# Patient Record
Sex: Female | Born: 1960
Health system: Southern US, Community
[De-identification: ages and names within clinical notes are randomized; demographics above are authoritative.]

## PROBLEM LIST (undated history)

## (undated) DIAGNOSIS — I10 Essential (primary) hypertension: Secondary | ICD-10-CM

## (undated) DIAGNOSIS — K859 Acute pancreatitis without necrosis or infection, unspecified: Secondary | ICD-10-CM

## (undated) DIAGNOSIS — E785 Hyperlipidemia, unspecified: Secondary | ICD-10-CM

## (undated) DIAGNOSIS — N809 Endometriosis, unspecified: Secondary | ICD-10-CM

## (undated) DIAGNOSIS — K5790 Diverticulosis of intestine, part unspecified, without perforation or abscess without bleeding: Secondary | ICD-10-CM

## (undated) DIAGNOSIS — Z8679 Personal history of other diseases of the circulatory system: Secondary | ICD-10-CM

## (undated) DIAGNOSIS — E119 Type 2 diabetes mellitus without complications: Secondary | ICD-10-CM

## (undated) DIAGNOSIS — K259 Gastric ulcer, unspecified as acute or chronic, without hemorrhage or perforation: Secondary | ICD-10-CM

## (undated) DIAGNOSIS — K8020 Calculus of gallbladder without cholecystitis without obstruction: Secondary | ICD-10-CM

## (undated) DIAGNOSIS — G43909 Migraine, unspecified, not intractable, without status migrainosus: Secondary | ICD-10-CM

## (undated) HISTORY — DX: Essential (primary) hypertension: I10

## (undated) HISTORY — DX: Acute pancreatitis without necrosis or infection, unspecified: K85.90

## (undated) HISTORY — DX: Migraine, unspecified, not intractable, without status migrainosus: G43.909

## (undated) HISTORY — DX: Diverticulosis of intestine, part unspecified, without perforation or abscess without bleeding: K57.90

## (undated) HISTORY — DX: Personal history of other diseases of the circulatory system: Z86.79

## (undated) HISTORY — DX: Calculus of gallbladder without cholecystitis without obstruction: K80.20

## (undated) HISTORY — DX: Hyperlipidemia, unspecified: E78.5

## (undated) HISTORY — DX: Endometriosis, unspecified: N80.9

## (undated) HISTORY — DX: Type 2 diabetes mellitus without complications: E11.9

## (undated) HISTORY — DX: Gastric ulcer, unspecified as acute or chronic, without hemorrhage or perforation: K25.9

---

## 1989-08-20 HISTORY — PX: ABDOMINAL HYSTERECTOMY: SHX81

## 1991-08-21 HISTORY — PX: CHOLECYSTECTOMY: SHX55

## 2002-08-20 HISTORY — PX: LUMBAR DISC SURGERY: SHX700

## 2008-10-12 DIAGNOSIS — E8941 Symptomatic postprocedural ovarian failure: Secondary | ICD-10-CM | POA: Insufficient documentation

## 2009-03-30 DIAGNOSIS — E785 Hyperlipidemia, unspecified: Secondary | ICD-10-CM | POA: Insufficient documentation

## 2010-05-24 DIAGNOSIS — R519 Headache, unspecified: Secondary | ICD-10-CM | POA: Insufficient documentation

## 2011-08-23 DIAGNOSIS — J310 Chronic rhinitis: Secondary | ICD-10-CM | POA: Insufficient documentation

## 2014-10-11 ENCOUNTER — Telehealth: Payer: Self-pay | Admitting: *Deleted

## 2014-10-11 ENCOUNTER — Ambulatory Visit (INDEPENDENT_AMBULATORY_CARE_PROVIDER_SITE_OTHER): Payer: BLUE CROSS/BLUE SHIELD | Admitting: Family

## 2014-10-11 ENCOUNTER — Encounter: Payer: Self-pay | Admitting: Family

## 2014-10-11 VITALS — BP 132/88 | HR 91 | Temp 98.0°F | Resp 16 | Ht 61.0 in | Wt 162.4 lb

## 2014-10-11 DIAGNOSIS — Z8679 Personal history of other diseases of the circulatory system: Secondary | ICD-10-CM | POA: Insufficient documentation

## 2014-10-11 DIAGNOSIS — Z8619 Personal history of other infectious and parasitic diseases: Secondary | ICD-10-CM

## 2014-10-11 DIAGNOSIS — Z23 Encounter for immunization: Secondary | ICD-10-CM

## 2014-10-11 DIAGNOSIS — G43909 Migraine, unspecified, not intractable, without status migrainosus: Secondary | ICD-10-CM

## 2014-10-11 DIAGNOSIS — Z0000 Encounter for general adult medical examination without abnormal findings: Secondary | ICD-10-CM

## 2014-10-11 DIAGNOSIS — I10 Essential (primary) hypertension: Secondary | ICD-10-CM

## 2014-10-11 DIAGNOSIS — E119 Type 2 diabetes mellitus without complications: Secondary | ICD-10-CM

## 2014-10-11 LAB — LIPID PANEL
CHOL/HDL RATIO: 3
Cholesterol: 169 mg/dL (ref 0–200)
HDL: 65.6 mg/dL (ref >39.00)
LDL CALC: 78 mg/dL (ref 0–99)
NONHDL: 103.4
TRIGLYCERIDES: 128 mg/dL (ref 0.0–149.0)
VLDL: 25.6 mg/dL (ref 0.0–40.0)

## 2014-10-11 LAB — BASIC METABOLIC PANEL
BUN: 11 mg/dL (ref 6–23)
CO2: 28 mEq/L (ref 19–32)
Calcium: 10.2 mg/dL (ref 8.4–10.5)
Chloride: 101 mEq/L (ref 96–112)
Creatinine, Ser: 0.59 mg/dL (ref 0.40–1.20)
GFR: 113.41 mL/min (ref >60.00)
GLUCOSE: 107 mg/dL — AB (ref 70–99)
Potassium: 4.3 mEq/L (ref 3.5–5.1)
Sodium: 138 mEq/L (ref 135–145)

## 2014-10-11 LAB — HEMOGLOBIN A1C: HEMOGLOBIN A1C: 9.1 % — AB (ref 4.6–6.5)

## 2014-10-11 LAB — MICROALBUMIN / CREATININE URINE RATIO
Creatinine,U: 80 mg/dL
MICROALB UR: 0.7 mg/dL (ref 0.0–1.9)
Microalb Creat Ratio: 0.9 mg/g (ref 0.0–30.0)

## 2014-10-11 MED ORDER — INSULIN ASPART 100 UNIT/ML FLEXPEN: PEN_INJECTOR | SUBCUTANEOUS | Status: DC

## 2014-10-11 MED ORDER — AMITRIPTYLINE HCL 25 MG PO TABS: 25.0000 mg | ORAL_TABLET | Freq: Every day | ORAL | Status: DC

## 2014-10-11 MED ORDER — JANUMET XR 50-1000 MG PO TB24: 1.0000 | ORAL_TABLET | Freq: Two times a day (BID) | ORAL | Status: DC

## 2014-10-11 NOTE — Assessment & Plan Note (Signed)
Uncontrolled. Suspect that her symptoms are worsened by rebound from excessive use of imitrex and excedrin migraine. Trial of HS elavil (was intolerant to beta blocker.) Advised sparing use of imitrex and excedrin.

## 2014-10-11 NOTE — Patient Instructions (Addendum)
Decrease lantus to 27 units once daily.  Start amitryptilline 25mg  once daily at bedtime.  Try to limit your use of excedrin and imitrex. Complete lab work prior to leaving. Schedule complete physical at the front.  You will be contacted about your referral to endocrinology and mammogram.

## 2014-10-11 NOTE — Telephone Encounter (Signed)
Health Maintenance  updated

## 2014-10-11 NOTE — Addendum Note (Signed)
Addended by: Mervin KungFERGERSON, Charrie Mcconnon A on: 10/11/2014 10:16 AM   Modules accepted: Orders

## 2014-10-11 NOTE — Assessment & Plan Note (Signed)
Denies hx of cardiac issues. No murmur on exam.

## 2014-10-11 NOTE — Assessment & Plan Note (Signed)
Controlled on amlodipine. Will review old records- pt thinks she was intolerant to ACE.

## 2014-10-11 NOTE — Telephone Encounter (Signed)
3/15. thanks

## 2014-10-11 NOTE — Progress Notes (Signed)
Subjective:    Patient ID: Kathryn Rogers, female    DOB: 01/23/1961, 54 y.o.   MRN: [id number]  HPI  Kathryn Rogers is a 54 yr old female who presents today to establish care.  1) DM2- she was diagnosed 2009.  Pt is currently maintained on the following medications for diabetes:janumet, lantus Last A1C was- She is unsure. Last diabetic eye exam was- march 2015- reports no diabetic retinopathy Denies polyuria/polydipsia. Reports that she walked yesterday and then developed hypoglycemia last night at 12midnight.  Comes up with a glass of mild.  This AM sugar was 186.    lantus 31 units, counts carbs 1 unit per carb.  Home glucose readings range- 103-119 fasting Reports that she had a pneumovax 5 years ago.  2) Migraines- feels like the weather can worsen her migraines.  Reports that she has migraines 3-5 times a week. Uses extra strength excedrin.  She reports that she could not tolerate a beta blocker- made her "loopy."   3) HTN-Patient is currently maintained on the following medications for blood pressure: amlodipine. She thinks that she was on ace in the past and this caused nausea.   Patient reports good compliance with blood pressure medications.  Patient denies chest pain, shortness of breath or swelling. Last 3 blood pressure readings in our office are as follows:  BP Readings from Last 3 Encounters:  10/11/14 132/88   4) Hx of rheumatic fever- denies hx of cardiac issues or heart murmur.    Review of Systems  Constitutional: Negative for unexpected weight change.  HENT: Negative for hearing loss, postnasal drip and rhinorrhea.        Chronic rhinorrhea which she attributes to allergies  Eyes: Negative for visual disturbance.  Respiratory: Negative for cough.   Cardiovascular: Negative for leg swelling.  Gastrointestinal: Negative for nausea, diarrhea and constipation.  Endocrine: Negative for polyuria.  Genitourinary: Negative for dysuria and frequency.  Musculoskeletal:  Negative for myalgias and arthralgias.  Skin: Negative for rash.  Neurological: Positive for headaches.  Hematological: Negative for adenopathy.  Psychiatric/Behavioral: Negative for dysphoric mood and agitation.       Denies depression/anxiety   Past Medical History  Diagnosis Date  . Migraine   . Diabetes mellitus without complication   . Hypertension   . History of rheumatic fever     age 66  . Hyperlipidemia   . Diverticulosis     History   Social History  . Marital Status: Married    Spouse Name: N/A  . Number of Children: N/A  . Years of Education: N/A   Occupational History  . Not on file.   Social History Main Topics  . Smoking status: Never Smoker   . Smokeless tobacco: Never Used  . Alcohol Use: No  . Drug Use: No  . Sexual Activity: Not on file   Other Topics Concern  . Not on file   Social History Narrative   [place] ([place]sisters in [place])   Homemaker   Completed 12th grade   2 daughter (6 grandchildren) one grandchild lives with her.   [place] and [place]   Enjoys spending time with her 53 year old grand-daughter   Previously worked in [employer].    Past Surgical History  Procedure Laterality Date  . Cholecystectomy  1993  . Abdominal hysterectomy  1991  . Lumbar disc surgery  2004    L5, discectomy and fusion  .  Cesarean section      x 2    Family History  Problem Relation Age of Onset  . Hypertension Father   . Cancer Paternal Aunt     breast  . Alcohol abuse Paternal Grandfather   . Cancer Cousin     breast    Allergies  Allergen Reactions  . Codeine Other (See Comments)    Dizziness, n/v, head itches    No current outpatient prescriptions on file prior to visit.   No current facility-administered medications on file prior to visit.    BP 132/88 mmHg  Pulse 91  Temp(Src) 98 F (36.7 C) (Oral)  Resp 16  Ht  (1.549 m)  Wt 162 lb 6.4 oz (73.664 kg)  BMI  30.70 kg/m2  SpO2 99%       Objective:   Physical Exam  Constitutional: She is oriented to person, place, and time. She appears well-developed and well-nourished.  HENT:  Head: Normocephalic and atraumatic.  Mouth/Throat: No oropharyngeal exudate.  Eyes: Pupils are equal, round, and reactive to light. No scleral icterus.  Neck: Neck supple.  Cardiovascular: Normal rate, regular rhythm and normal heart sounds.   No murmur heard. Pulmonary/Chest: Effort normal and breath sounds normal. No respiratory distress. She has no wheezes.  Musculoskeletal: She exhibits no edema.  Lymphadenopathy:    She has no cervical adenopathy.  Neurological: She is alert and oriented to person, place, and time.  Skin: Skin is warm and dry.  Psychiatric: She has a normal mood and affect. Her behavior is normal. Judgment and thought content normal.          Assessment & Plan:  Pt requests order for screening mammogram.

## 2014-10-11 NOTE — Assessment & Plan Note (Signed)
Due to reports of hypoglycemia, will decrease her lantus from 31 units to 27 units once daily. Continue janumet, refer to endo.

## 2014-10-11 NOTE — Telephone Encounter (Signed)
-----   Message from Kathryn CrazeMelissa O'Sullivan, NP sent at 10/11/2014  8:22 AM EST ----- Pt had eye exam 3/16- negative for retinopathy.  thanks

## 2014-10-11 NOTE — Telephone Encounter (Signed)
Kathryn KaufmannMelissa-- is this 11/02/13? Or just 10/2013?

## 2014-10-14 ENCOUNTER — Telehealth: Payer: Self-pay | Admitting: Family

## 2014-10-14 NOTE — Telephone Encounter (Signed)
Attempted to reach pt at # in chart [phone]) but it doesn't ring; just silence. Sent pt [employer] message.

## 2014-10-14 NOTE — Telephone Encounter (Signed)
Please let pt know that her diabetes is not well controlled per lab work. I would like her to meet with endocrinology as we discussed at her visit. She should be contacted about her referral.

## 2014-10-26 ENCOUNTER — Telehealth: Payer: Self-pay | Admitting: *Deleted

## 2014-10-26 MED ORDER — ONETOUCH ULTRASOFT LANCETS MISC: 1.0000 | Status: DC | PRN

## 2014-10-26 NOTE — Telephone Encounter (Signed)
[facility]. Refill sent as pt has not established with endocrinology yet.

## 2014-10-27 ENCOUNTER — Telehealth: Payer: Self-pay | Admitting: *Deleted

## 2014-10-27 MED ORDER — SUMATRIPTAN SUCCINATE 100 MG PO TABS: 100.0000 mg | ORAL_TABLET | ORAL | Status: DC | PRN

## 2014-10-27 NOTE — Telephone Encounter (Signed)
Received fax requesting refill of sumatriptan 100mg  to [facility] [address]. Refill sent.

## 2014-11-05 ENCOUNTER — Ambulatory Visit (INDEPENDENT_AMBULATORY_CARE_PROVIDER_SITE_OTHER): Payer: BLUE CROSS/BLUE SHIELD | Admitting: Internal Medicine

## 2014-11-05 ENCOUNTER — Encounter: Payer: Self-pay | Admitting: Internal Medicine

## 2014-11-05 VITALS — BP 122/78 | HR 95 | Temp 98.8°F | Resp 12 | Ht 59.75 in | Wt 159.0 lb

## 2014-11-05 DIAGNOSIS — E119 Type 2 diabetes mellitus without complications: Secondary | ICD-10-CM

## 2014-11-05 MED ORDER — INSULIN GLARGINE 300 UNIT/ML ~~LOC~~ SOPN: 24.0000 [IU] | PEN_INJECTOR | Freq: Every day | SUBCUTANEOUS | Status: DC

## 2014-11-05 MED ORDER — GLIPIZIDE 5 MG PO TABS: 5.0000 mg | ORAL_TABLET | Freq: Two times a day (BID) | ORAL | Status: DC

## 2014-11-05 NOTE — Patient Instructions (Signed)
Please stop Lantus. Stop Humalog for now.  Please start Toujeo 24 units in am. Continue Janumet XR 50-1000 mg 2x a day, but move them before b'fast and before dinner. Start Glipizide 5 mg 2x a day, before b'fast and before dinner. You may increase the dose to 10 mg 2x a day, if sugars not better later in the day and if you have no lows. Please do not skip meals when you take this medicine.  Please let me know if the sugars are consistently <80 or >200.  Please call and schedule an eye appt with Dr. Randon Goldsmith: [facility]:  Dr. Jeni Salles MD ?  Address: [address] [place], [place], [place] [address]  [phone]  - Healthy nutrition info websites:  http://nutritionfacts.org  - Cinnamon issue:  EyeTint.com.ee  S[employer]In[employer].es.aspx  PATIENT INSTRUCTIONS FOR TYPE 2 DIABETES:  **Please join MyChart!** - see attached instructions about how to join if you have not done so already.  DIET AND EXERCISE Diet and exercise is an important part of diabetic treatment.  We recommended aerobic exercise in the form of brisk walking (working between 40-60% of maximal aerobic capacity, similar to brisk walking) for 150 minutes per week (such as 30 minutes five days per week) along with 3 times per week performing 'resistance' training (using various gauge rubber tubes with handles) 5-10 exercises involving the major muscle groups (upper body, lower body and core) performing 10-15 repetitions (or near fatigue) each exercise. Start at half the above goal but build slowly to reach the above goals. If limited by weight, joint pain, or disability, we recommend daily walking in a swimming pool with water up to waist to reduce pressure from joints while allow for adequate exercise.    BLOOD GLUCOSES Monitoring your blood glucoses is important for continued management of your  diabetes. Please check your blood glucoses 2-4 times a day: fasting, before meals and at bedtime (you can rotate these measurements - e.g. one day check before the 3 meals, the next day check before 2 of the meals and before bedtime, etc.).   HYPOGLYCEMIA (low blood sugar) Hypoglycemia is usually a reaction to not eating, exercising, or taking too much insulin/ other diabetes drugs.  Symptoms include tremors, sweating, hunger, confusion, headache, etc. Treat IMMEDIATELY with 15 grams of Carbs: . 4 glucose tablets .  cup regular juice/soda . 2 tablespoons raisins . 4 teaspoons sugar . 1 tablespoon honey Recheck blood glucose in 15 mins and repeat above if still symptomatic/blood glucose <100.  RECOMMENDATIONS TO REDUCE YOUR RISK OF DIABETIC COMPLICATIONS: * Take your prescribed MEDICATION(S) * Follow a DIABETIC diet: Complex carbs, fiber rich foods, (monounsaturated and polyunsaturated) fats * AVOID saturated/trans fats, high fat foods, >2,300 mg salt per day. * EXERCISE at least 5 times a week for 30 minutes or preferably daily.  * DO NOT SMOKE OR DRINK more than 1 drink a day. * Check your FEET every day. Do not wear tightfitting shoes. Contact us if you develop an ulcer * See your EYE doctor once a year or more if needed * Get a FLU shot once a year * Get a PNEUMONIA vaccine once before and once after age 48 years  GOALS:  * Your Hemoglobin A1c of <7%  * fasting sugars need to be <130 * after meals sugars need to be <180 (2h after you start eating) * Your Systolic BP should be 140 or lower  * Your Diastolic BP should be 80 or lower  *  Your HDL (Good Cholesterol) should be 40 or higher  * Your LDL (Bad Cholesterol) should be 100 or lower. * Your Triglycerides should be 150 or lower  * Your Urine microalbumin (kidney function) should be <30 * Your Body Mass Index should be 25 or lower    Please consider the following ways to cut down carbs and fat and increase fiber and  micronutrients in your diet: - substitute whole grain for white bread or pasta - substitute brown rice for white rice - substitute 90-calorie flat bread pieces for slices of bread when possible - substitute sweet potatoes or yams for white potatoes - substitute humus for margarine - substitute tofu for cheese when possible - substitute almond or rice milk for regular milk (would not drink soy milk daily due to concern for soy estrogen influence on breast cancer risk) - substitute dark chocolate for other sweets when possible - substitute water - can add lemon or orange slices for taste - for diet sodas (artificial sweeteners will trick your body that you can eat sweets without getting calories and will lead you to overeating and weight gain in the long run) - do not skip breakfast or other meals (this will slow down the metabolism and will result in more weight gain over time)  - can try smoothies made from fruit and almond/rice milk in am instead of regular breakfast - can also try old-fashioned (not instant) oatmeal made with almond/rice milk in am - order the dressing on the side when eating salad at a restaurant (pour less than half of the dressing on the salad) - eat as little meat as possible - can try juicing, but should not forget that juicing will get rid of the fiber, so would alternate with eating raw veg./fruits or drinking smoothies - use as little oil as possible, even when using olive oil - can dress a salad with a mix of balsamic vinegar and lemon juice, for e.g. - use agave nectar, stevia sugar, or regular sugar rather than artificial sweateners - steam or broil/roast veggies  - snack on veggies/fruit/nuts (unsalted, preferably) when possible, rather than processed foods - reduce or eliminate aspartame in diet (it is in diet sodas, chewing gum, etc) Read the labels!  Try to read Dr. Katherina RightNeal Barnard's book: "Program for Reversing Diabetes" for other ideas for healthy  eating.

## 2014-11-05 NOTE — Progress Notes (Signed)
Patient ID: Kathryn Rogers, female   DOB: 04/03/1961, 54 y.o.   MRN: [id number]  HPI: Kathryn Rogers is a 54 y.o.-year-old female, referred by her PCP, O'SULLIVAN,MELISSA S., NP, for management of DM2, dx in 2010, insulin-dependent since dx, uncontrolled, without complications.  Last hemoglobin A1c was: Lab Results  Component Value Date   HGBA1C 9.1* 10/11/2014  Before this, HbA1c was lower.  Pt is on a regimen of: - JanuMet XR 50-1000 mg 2x a day - before b'fast and lunch. - Lantus 29 units at bedtime - Novolog 3x a day, before meals - 1 Units per carb choice, ave 2 units per meal  Takes alphalipoic acid. Used to take Cinammon.  Pt checks her sugars 2x a day and they are: - am: 60, 110-115, 288  - 2h after b'fast: n/c - before lunch: n/c - 2h after lunch: 135-343 (may take 2 units of insulin) - before dinner: 49 x1, 93-394 - 2h after dinner:  - bedtime: 48 x1 - nighttime: n/c + lows. Lowest sugar was 30s, at night - lately 48 at night; she has hypoglycemia awareness at 80.  Highest sugar was in the 300s.  Glucometer: One Touch mini  Pt's meals are: - Breakfast: cereal or carnation instant b'fast - Lunch: grilled salmon, salad + apple - Dinner: grille chicken, grilled grilled, baked potatoes - Snacks: 2 maybe  She walks for exercise.  - no CKD, last BUN/creatinine:  Lab Results  Component Value Date   BUN 11 10/11/2014   CREATININE 0.59 10/11/2014   - last set of lipids: Lab Results  Component Value Date   CHOL 169 10/11/2014   HDL 65.60 10/11/2014   LDLCALC 78 10/11/2014   TRIG 128.0 10/11/2014   CHOLHDL 3 10/11/2014   - last eye exam was in 10/2013. No DR.  - no numbness and tingling in her feet.  Pt has no FH of DM.  ROS: Constitutional: + Weight loss, +  fatigue, +  hot flashes Eyes: no blurry vision, no xerophthalmia ENT: no sore throat, no nodules palpated in throat, no dysphagia/odynophagia, no hoarseness Cardiovascular: no CP/SOB/palpitations/leg  swelling Respiratory: no cough/SOB Gastrointestinal: no N/V/D/C Musculoskeletal: no muscle/joint aches Skin: no rashes Neurological: no tremors/numbness/tingling/dizziness, + HA Psychiatric: no depression/anxiety + low libido  Past Medical History  Diagnosis Date  . Migraine   . Diabetes mellitus without complication   . Hypertension   . History of rheumatic fever     age 89  . Hyperlipidemia   . Diverticulosis    Past Surgical History  Procedure Laterality Date  . Cholecystectomy  1993  . Abdominal hysterectomy  1991  . Lumbar disc surgery  2004    L5, discectomy and fusion  . Cesarean section      x 2   History   Social History  . Marital Status: Married    Spouse Name: N/A   Social History Main Topics  . Smoking status: Never Smoker   . Smokeless tobacco: Never Used  . Alcohol Use: No  . Drug Use: No   Social History Narrative   [place] in [place]   Homemaker   Completed 12th grade   2 daughter (6 grandchildren) one grandchild lives with her.   1 daughter in [place] and 1 in [place]   Enjoys spending time with her 53 year old grand-daughter   Previously worked in [employer].   Current Outpatient Prescriptions on File Prior to Visit  Medication Sig  Dispense Refill  . amitriptyline (ELAVIL) 25 MG tablet Take 1 tablet (25 mg total) by mouth at bedtime. 30 tablet 2  . amLODipine (NORVASC) 5 MG tablet Take 1 tablet by mouth daily.  0  . aspirin EC 81 MG tablet Take 81 mg by mouth daily.    . fluticasone (FLONASE) 50 MCG/ACT nasal spray As needed  0  . glucose blood test strip Use as instructed to check blood sugar 3 times daily.    . insulin aspart (NOVOLOG) 100 UNIT/ML FlexPen Use as tid prn. 15 mL 1  . Insulin Pen Needle 31G X 8 MM MISC by Does not apply route. Use to give insulin injections 3-4 times daily    . JANUMET XR 50-1000 MG TB24 Take 1 tablet by mouth 2 (two) times daily. 90 tablet 1  . Lancets  (ONETOUCH ULTRASOFT) lancets 1 each by Other route as needed for other. Use as instructed to check blood sugar 4 times a day. 400 each 0  . LANTUS SOLOSTAR 100 UNIT/ML Solostar Pen Inject 27 Units into the skin every morning.   2  . promethazine (PHENERGAN) 25 MG tablet Take 25 mg by mouth every 6 (six) hours as needed for nausea or vomiting (migraine).    . SUMAtriptan (IMITREX) 100 MG tablet Take 1 tablet (100 mg total) by mouth as needed. 10 tablet 3  . SUMAtriptan (IMITREX) 20 MG/ACT nasal spray Use 1 dose in the nostril as needed for migraine  5   No current facility-administered medications on file prior to visit.   Allergies  Allergen Reactions  . Codeine Other (See Comments)    Dizziness, n/v, head itches   Family History  Problem Relation Age of Onset  . Hypertension Father   . Cancer Paternal Aunt     breast  . Alcohol abuse Paternal Grandfather   . Cancer Cousin     breast   PE: BP 122/78 mmHg  Pulse 95  Temp(Src) 98.8 F (37.1 C) (Oral)  Resp 12  Ht 4' 11.75" (1.518 m)  Wt 159 lb (72.122 kg)  BMI 31.30 kg/m2  SpO2 97% Wt Readings from Last 3 Encounters:  11/05/14 159 lb (72.122 kg)  10/11/14 162 lb 6.4 oz (73.664 kg)   Constitutional: overweight, in NAD Eyes: PERRLA, EOMI, no exophthalmos ENT: moist mucous membranes, no thyromegaly, no cervical lymphadenopathy Cardiovascular: Tachycardia,RR, No MRG Respiratory: CTA B Gastrointestinal: abdomen soft, NT, ND, BS+ Musculoskeletal: no deformities, strength intact in all 4 Skin: moist, warm, no rashes Neurological: no tremor with outstretched hands, DTR normal in all 4  ASSESSMENT: 1. DM2, insulin-dependent, uncontrolled, without complications  PLAN:  1. Patient with long-standing, uncontrolled diabetes, on a regimen of basal insulin (+ few units of mealtime rapid-acting insulin) + JanuMet, which is insufficient. Her sugars are fluctuating in the morning, but in general lower than the ones later in the day, in  a staircase fashion. She can also have lows at night, in the 40s. I explained that this pattern is usually seen with an excess of basal insulin and not enough mealtime coverage. We will attempt to decrease the basal insulin and, per her request, try to use toujeo. Also, since she is using very few units of rapid acting insulin during the day, will try to stop this and start glipizide before breakfast and dinner. I am not sure if this is enough but I advised her to check her sugars frequently and let me know if they stay high or low. - I  would've also wanted to check her for type 1 diabetes, however, her sugars in the office today was 240, too high for [place] to get a reliable C-peptide level. I plan to do this at next visit. - We discussed about options for treatment, and I suggested to:  Patient Instructions  Please stop Lantus. Stop Humalog for now.  Please start Toujeo 24 units in am. Continue Janumet XR 50-1000 mg 2x a day, but move them before b'fast and before dinner. Start Glipizide 5 mg 2x a day, before b'fast and before dinner. You may increase the dose to 10 mg 2x a day, if sugars not better later in the day and if you have no lows. Please do not skip meals when you take this medicine.  Please let me know if the sugars are consistently <80 or >200.  Please call and schedule an eye appt with Dr. Randon Goldsmith: [facility]:  Dr. Jeni Salles MD ?  Address: [address] [place], [place], [place] [address]  [phone] - I recommended her to schedule another eye exam. She needs a new eye Dr., since she moved to [facility] recently and does not have one. Last eye exam was a year ago. No retinopathy per her report - We also discussed about nutrition, but she is not interested in a referral to the nutritionist as of now. - Strongly advised her to start checking sugars at different times of the day - check 3 times a day, rotating checks - given sugar log and advised how to fill it  and to bring it at next appt  - given foot care handout and explained the principles  - given instructions for hypoglycemia management "15-15 rule"  - Return to clinic in 1 mo with sugar log

## 2014-12-15 ENCOUNTER — Ambulatory Visit (INDEPENDENT_AMBULATORY_CARE_PROVIDER_SITE_OTHER): Payer: BLUE CROSS/BLUE SHIELD | Admitting: Internal Medicine

## 2014-12-15 ENCOUNTER — Encounter: Payer: Self-pay | Admitting: Internal Medicine

## 2014-12-15 VITALS — BP 132/90 | HR 88 | Temp 97.9°F | Ht 59.75 in | Wt 159.0 lb

## 2014-12-15 DIAGNOSIS — E119 Type 2 diabetes mellitus without complications: Secondary | ICD-10-CM | POA: Diagnosis not present

## 2014-12-15 MED ORDER — INSULIN ASPART 100 UNIT/ML FLEXPEN: 3.0000 [IU] | PEN_INJECTOR | Freq: Three times a day (TID) | SUBCUTANEOUS | Status: DC

## 2014-12-15 MED ORDER — INSULIN GLARGINE 300 UNIT/ML ~~LOC~~ SOPN: 25.0000 [IU] | PEN_INJECTOR | Freq: Every day | SUBCUTANEOUS | Status: DC

## 2014-12-15 NOTE — Patient Instructions (Signed)
Please continue: - Toujeo 25 units in am. - Janumet XR 50-1000 mg 2x a day before b'fast and before dinner.  Please stop glipizide.  Please start back: - NovoLog 3x a day, 15 min before a meal: 3 units for a small meal 4 units for a regular meal 5 units for a large meal or if you have dessert  Please return in 5 weeks with your sugar log.   Please let me know if the sugars are consistently <80 or >200.  Please stop at the lab.

## 2014-12-15 NOTE — Progress Notes (Signed)
Patient ID: Kathryn Rogers, female   DOB: Mar 15, 1961, 54 y.o.   MRN: [id number]  HPI: Kathryn Rogers is a 54 y.o.-year-old female, returning for management of DM2, dx in 2010, insulin-dependent since dx, uncontrolled, without complications. Last visit 1.5 mo ago.  Last hemoglobin A1c was: Lab Results  Component Value Date   HGBA1C 9.1* 10/11/2014  Before this, HbA1c was lower.  Pt is on a regimen of: - JanuMet XR 50-1000 mg 2x a day - before b'fast and lunch. - Toujeo 25 units at bedtime - Glipizide 5 mg 2x a day - added 10/2014 We stopped Novolog 3x a day, before meals - 1 Units per carb choice, ave 2 units per meal Takes alphalipoic acid. Used to take Cinammon.  Pt checks her sugars 2x a day and they are: - am: 60, 110-115, 288 >> 39x1, 50, 71-220, 276, 298 - 2h after b'fast: n/c >> 373 (forget meds that am) - before lunch: n/c >> 167 - 2h after lunch: 135-343 (may take 2 units of insulin) >> 204-330 - before dinner: 49 x1, 93-394 >> 176-233, 332 - 2h after dinner: 107-273, 488x1 - bedtime: 48 x1 >> n/c - nighttime: n/c + lows, but less than before. Lowest sugar was 30s; but had 1 low in am today (did not check) - exercise last night; she has hypoglycemia awareness at 80.  Highest sugar was in the 270.  Glucometer: One Touch mini  Pt's meals are: - Breakfast: cereal or carnation instant b'fast - Lunch: grilled salmon, salad + apple - Dinner: grille chicken, grilled grilled, baked potatoes - Snacks: 2 maybe  She walks for exercise.  - no CKD, last BUN/creatinine:  Lab Results  Component Value Date   BUN 11 10/11/2014   CREATININE 0.59 10/11/2014   - last set of lipids: Lab Results  Component Value Date   CHOL 169 10/11/2014   HDL 65.60 10/11/2014   LDLCALC 78 10/11/2014   TRIG 128.0 10/11/2014   CHOLHDL 3 10/11/2014   - last eye exam was in 10/2013. No DR.  - no numbness and tingling in her feet.  ROS: Constitutional: no Weight loss, no  fatigue, +  hot  flashes Eyes: no blurry vision, no xerophthalmia ENT: no sore throat, no nodules palpated in throat, no dysphagia/odynophagia, no hoarseness Cardiovascular: no CP/SOB/palpitations/leg swelling Respiratory: no cough/SOB Gastrointestinal: no N/V/D/C Musculoskeletal: no muscle/joint aches Skin: no rashes Neurological: no tremors/numbness/tingling/dizziness  I reviewed pt's medications, allergies, PMH, social hx, family hx, and changes were documented in the history of present illness. Otherwise, unchanged from my initial visit note: Past Medical History  Diagnosis Date  . Migraine   . Diabetes mellitus without complication   . Hypertension   . History of rheumatic fever     age 54  . Hyperlipidemia   . Diverticulosis    Past Surgical History  Procedure Laterality Date  . Cholecystectomy  1993  . Abdominal hysterectomy  1991  . Lumbar disc surgery  2004    L5, discectomy and fusion  . Cesarean section      x 2   History   Social History  . Marital Status: Married    Spouse Name: N/A   Social History Main Topics  . Smoking status: Never Smoker   . Smokeless tobacco: Never Used  . Alcohol Use: No  . Drug Use: No   Social History Narrative   Moved from [place] 2016, originally from [facility] (parents/sisters in [place])   Homemaker   Completed 12th grade  2 daughter (6 grandchildren) one grandchild lives with her.   1 daughter in [place] and 1 in [place]   Enjoys spending time with her 53 year old grand-daughter   Previously worked in [employer].   Current Outpatient Prescriptions on File Prior to Visit  Medication Sig Dispense Refill  . amitriptyline (ELAVIL) 25 MG tablet Take 1 tablet (25 mg total) by mouth at bedtime. 30 tablet 2  . amLODipine (NORVASC) 5 MG tablet Take 1 tablet by mouth daily.  0  . aspirin EC 81 MG tablet Take 81 mg by mouth daily.    . fluticasone (FLONASE) 50 MCG/ACT nasal spray As needed  0  . glipiZIDE (GLUCOTROL) 5 MG tablet Take 1 tablet (5 mg  total) by mouth 2 (two) times daily before a meal. 60 tablet 5  . glucose blood test strip Use as instructed to check blood sugar 3 times daily.    . Insulin Glargine (TOUJEO SOLOSTAR) 300 UNIT/ML SOPN Inject 24 Units into the skin daily before breakfast. 3 pen 2  . Insulin Pen Needle 31G X 8 MM MISC by Does not apply route. Use to give insulin injections 3-4 times daily    . JANUMET XR 50-1000 MG TB24 Take 1 tablet by mouth 2 (two) times daily. 90 tablet 1  . Lancets (ONETOUCH ULTRASOFT) lancets 1 each by Other route as needed for other. Use as instructed to check blood sugar 4 times a day. 400 each 0  . promethazine (PHENERGAN) 25 MG tablet Take 25 mg by mouth every 6 (six) hours as needed for nausea or vomiting (migraine).    . SUMAtriptan (IMITREX) 100 MG tablet Take 1 tablet (100 mg total) by mouth as needed. 10 tablet 3  . SUMAtriptan (IMITREX) 20 MG/ACT nasal spray Use 1 dose in the nostril as needed for migraine  5   No current facility-administered medications on file prior to visit.   Allergies  Allergen Reactions  . Codeine Other (See Comments)    Dizziness, n/v, head itches   Family History  Problem Relation Age of Onset  . Hypertension Father   . Cancer Paternal Aunt     breast  . Alcohol abuse Paternal Grandfather   . Cancer Cousin     breast   PE: BP 132/90 mmHg  Pulse 88  Temp(Src) 97.9 F (36.6 C) (Oral)  Ht 4' 11.75" (1.518 m)  Wt 159 lb (72.122 kg)  BMI 31.30 kg/m2  SpO2 97% Wt Readings from Last 3 Encounters:  12/15/14 159 lb (72.122 kg)  11/05/14 159 lb (72.122 kg)  10/11/14 162 lb 6.4 oz (73.664 kg)   Constitutional: overweight, in NAD Eyes: PERRLA, EOMI, no exophthalmos ENT: moist mucous membranes, no thyromegaly, no cervical lymphadenopathy Cardiovascular: Tachycardia,RR, No MRG Respiratory: CTA B Gastrointestinal: abdomen soft, NT, ND, BS+ Musculoskeletal: no deformities, strength intact in all 4 Skin: moist, warm, no rashes Neurological: no  tremor with outstretched hands, DTR normal in all 4  ASSESSMENT: 1. DM2, insulin-dependent, uncontrolled, without complications  PLAN:  1. Patient with long-standing, uncontrolled diabetes, on a regimen of basal insulin (switched to Toujeo at last visit) + JanuMet + added Glipizide instead of mealtime insulin at last visit. We will stop Glipizide and restart Novolog as this worked better for her. Her sugars are still greatly fluctuating  - I will also check her for type 1 diabetes - sugars in the office today was 125: Orders Placed This Encounter  Procedures  . C-peptide  . Glucose, Fasting  .  Glutamic acid decarboxylase auto abs  . Anti-islet cell antibody  - We discussed about options for treatment, and I suggested to:    Patient Instructions  Please continue: - Toujeo 25 units in am. - Janumet XR 50-1000 mg 2x a day before b'fast and before dinner.  Please stop glipizide.  Please start back: - NovoLog 3x a day, 15 min before a meal: 3 units for a small meal 4 units for a regular meal 5 units for a large meal or if you have dessert  Please return in 5 weeks with your sugar log.   Please let me know if the sugars are consistently <80 or >200.  Please stop at the lab.  - not interested in a referral to the nutritionist as of now. - continue checking sugars at different times of the day - check 3 times a day, rotating checks - Return to clinic in 5 weeks with sugar log   Office Visit on 12/15/2014  Component Date Value Ref Range Status  . C-Peptide 12/15/2014 <0.05* 0.80 - 3.90 ng/mL Final  . Glucose, Fasting 12/15/2014 118* 70 - 99 mg/dL Final   Comment:     < 100  mg/dL = normal fasting glucose 100-125  mg/dL = IFG (impaired fasting glucose)   > 125  mg/dL = provisional diagnosis of diabetes     . Glutamic Acid Decarb Ab 12/15/2014 <5  <5 IU/mL Final   Comment:  This test was performed using the GAD65 ELISA method.  New method, ELISA, is standardized against the   International reference preparation 97/550, is reported  in International Units/mL (IU/mL) and a new reference  range was implemented.   . Pancreatic Islet Cell Antibody 12/15/2014 <5  < 5 JDF Units Final   Comment:    Laboratory Developed Test performed using a reagent labeled by  the manufacturer as ASR Class I or ASR Class II (non-blood bank).    This test(s) was developed and its performance characteristics  have been determined by [employer],  [place], [place]. It has not been cleared or approved by the U.S.  Food and Drug Administration. The FDA has determined that such  clearance or approval is not necessary. Performance  characteristics refer to the analytical performance of the test.    C-peptide undetectable. Likely LADA or DM1. Pancreatic antibodies negative. We definitely need back mealtime insulin. For now, will continue the Janumet.

## 2014-12-16 LAB — GLUCOSE, FASTING: GLUCOSE, FASTING: 118 mg/dL — AB (ref 70–99)

## 2014-12-16 LAB — C-PEPTIDE: C-Peptide: <0.05 ng/mL — ABNORMAL LOW (ref 0.80–3.90)

## 2014-12-21 LAB — ANTI-ISLET CELL ANTIBODY

## 2014-12-28 ENCOUNTER — Other Ambulatory Visit: Payer: Self-pay | Admitting: Family

## 2014-12-28 LAB — GLUTAMIC ACID DECARBOXYLASE AUTO ABS: Glutamic Acid Decarb Ab: <5 IU/mL (ref <5)

## 2014-12-29 ENCOUNTER — Other Ambulatory Visit: Payer: Self-pay | Admitting: *Deleted

## 2014-12-29 ENCOUNTER — Telehealth: Payer: Self-pay | Admitting: Internal Medicine

## 2014-12-29 MED ORDER — JANUMET XR 50-1000 MG PO TB24: 1.0000 | ORAL_TABLET | Freq: Two times a day (BID) | ORAL | Status: DC

## 2014-12-29 NOTE — Telephone Encounter (Signed)
Patient called stating she would like a refill on her rx  Rx: Janumet   Pharmacy: [facility] [address]    Thank You

## 2014-12-29 NOTE — Telephone Encounter (Signed)
Done

## 2015-01-04 LAB — HM DIABETES EYE EXAM

## 2015-01-25 ENCOUNTER — Telehealth: Payer: Self-pay | Admitting: Family

## 2015-01-25 ENCOUNTER — Ambulatory Visit (INDEPENDENT_AMBULATORY_CARE_PROVIDER_SITE_OTHER): Payer: BLUE CROSS/BLUE SHIELD | Admitting: Internal Medicine

## 2015-01-25 ENCOUNTER — Encounter: Payer: Self-pay | Admitting: Internal Medicine

## 2015-01-25 VITALS — BP 124/76 | HR 84 | Temp 98.4°F | Resp 12 | Wt 158.6 lb

## 2015-01-25 DIAGNOSIS — E119 Type 2 diabetes mellitus without complications: Secondary | ICD-10-CM

## 2015-01-25 LAB — HEMOGLOBIN A1C: HEMOGLOBIN A1C: 8.8 % — AB (ref 4.6–6.5)

## 2015-01-25 NOTE — Progress Notes (Signed)
Patient ID: Kathryn Rogers, female   DOB: [birth year], 54 y.o.   MRN: [id number]  HPI: Kathryn Rogers is a 54 y.o.-year-old female, returning for management of DM2, dx in 2010, insulin-dependent since dx, uncontrolled, without complications. Last visit 1.5 mo ago.  Last hemoglobin A1c was: Lab Results  Component Value Date   HGBA1C 9.1* 10/11/2014  Before this, HbA1c was lower.  At last visit, we checked labs for DM1: negative ABs but undetectable C-pp: Component     Latest Ref Rng 12/15/2014  C-Peptide     0.80 - 3.90 ng/mL <0.05 (L)  Glucose, Fasting     70 - 99 mg/dL 193 (H)  Glutamic Acid Decarb Ab     <5 IU/mL <5  Pancreatic Islet Cell Antibody     < 5 JDF Units <5   Pt is on a regimen of: - JanuMet XR 50-1000 mg 2x a day - before b'fast and in the pm - Toujeo 25 >> 27 units at bedtime - NovoLog 3x a day, 15 min before a meal - restarted 11/2014: 3 units for a small meal 4 units for a regular meal 5 units for a large meal or if you have dessert We stopped Glipizide 11/2014 when started NovoLog. Was on Novolog before - 3x a day, before meals - 1 Units per carb choice (15 g carbs), ave 3 units per meal Takes alphalipoic acid. Used to take Cinammon.  Pt checks her sugars 2x a day and they are: - am: 60, 110-115, 288 >> 39x1, 50, 71-220, 276, 298 >> 73, 81-213 - 2h after b'fast: n/c >> 373 (forget meds that am) - before lunch: n/c >> 167 >> 145-175, 320 - 2h after lunch: 135-343 (may take 2 units of insulin) >> 204-330 >>  79, 142-266 - before dinner: 49 x1, 93-394 >> 176-233, 332 >> 48, 72-239 >> 48, 72-239 - 2h after dinner: 107-273, 488x1 >> 129-279 - bedtime: 48 x1 >> n/c - nighttime: n/c + lows, but less than before. Lowest sugar was 40s (after activity); she has hypoglycemia awareness at 80.  Highest sugar was in the 300s  Glucometer: One Touch mini  Pt's meals are: - Breakfast: cereal or carnation instant b'fast - Lunch: grilled salmon, salad + apple - Dinner:  grille chicken, grilled grilled, baked potatoes - Snacks: 2 maybe  She walks for exercise.  - no CKD, last BUN/creatinine:  Lab Results  Component Value Date   BUN 11 10/11/2014   CREATININE 0.59 10/11/2014   - last set of lipids: Lab Results  Component Value Date   CHOL 169 10/11/2014   HDL 65.60 10/11/2014   LDLCALC 78 10/11/2014   TRIG 128.0 10/11/2014   CHOLHDL 3 10/11/2014   - last eye exam was in 12/2014. [facility]. No DR.  - no numbness and tingling in her feet.  ROS: Constitutional: no Weight loss, no  fatigue, +  hot flashes Eyes: no blurry vision, no xerophthalmia ENT: no sore throat, no nodules palpated in throat, no dysphagia/odynophagia, no hoarseness Cardiovascular: no CP/SOB/palpitations/leg swelling Respiratory: no cough/SOB Gastrointestinal: no N/V/D/C Musculoskeletal: no muscle/joint aches Skin: no rashes Neurological: no tremors/numbness/tingling/dizziness  I reviewed pt's medications, allergies, PMH, social hx, family hx, and changes were documented in the history of present illness. Otherwise, unchanged from my initial visit note: Past Medical History  Diagnosis Date  . Migraine   . Diabetes mellitus without complication   . Hypertension   . History of rheumatic fever     age 89  .  Hyperlipidemia   . Diverticulosis    Past Surgical History  Procedure Laterality Date  . Cholecystectomy  1993  . Abdominal hysterectomy  1991  . Lumbar disc surgery  2004    L5, discectomy and fusion  . Cesarean section      x 2   History   Social History  . Marital Status: Married    Spouse Name: N/A   Social History Main Topics  . Smoking status: Never Smoker   . Smokeless tobacco: Never Used  . Alcohol Use: No  . Drug Use: No   Social History Narrative   Moved from [place] 2016, originally from [facility] (parents/sisters in [place])   Homemaker   Completed 12th grade   2 daughter (6 grandchildren) one grandchild lives with her.   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   Current Outpatient Prescriptions on File Prior to Visit  Medication Sig Dispense Refill  . amitriptyline (ELAVIL) 25 MG tablet Take 1 tablet (25 mg total) by mouth at bedtime. 30 tablet 2  . amLODipine (NORVASC) 5 MG tablet Take 1 tablet by mouth daily.  0  . aspirin EC 81 MG tablet Take 81 mg by mouth daily.    . fluticasone (FLONASE) 50 MCG/ACT nasal spray As needed  0  . glucose blood test strip Use as instructed to check blood sugar 3 times daily.    . insulin aspart (NOVOLOG FLEXPEN) 100 UNIT/ML FlexPen Inject 3-5 Units into the skin 3 (three) times daily with meals. 15 mL 1  . Insulin Glargine (TOUJEO SOLOSTAR) 300 UNIT/ML SOPN Inject 25 Units into the skin daily before breakfast. 3 pen 2  . Insulin Pen Needle 31G X 8 MM MISC by Does not apply route. Use to give insulin injections 3-4 times daily    . JANUMET XR 50-1000 MG TB24 Take 1 tablet by mouth 2 (two) times daily. 60 tablet 2  . Lancets (ONETOUCH ULTRASOFT) lancets 1 each by Other route as needed for other. Use as instructed to check blood sugar 4 times a day. 400 each 0  . promethazine (PHENERGAN) 25 MG tablet Take 25 mg by mouth every 6 (six) hours as needed for nausea or vomiting (migraine).    . SUMAtriptan (IMITREX) 100 MG tablet Take 1 tablet (100 mg total) by mouth as needed. 10 tablet 3  . SUMAtriptan (IMITREX) 20 MG/ACT nasal spray Use 1 dose in the nostril as needed for migraine  5   No current facility-administered medications on file prior to visit.   Allergies  Allergen Reactions  . Codeine Other (See Comments)    Dizziness, n/v, head itches   Family History  Problem Relation Age of Onset  . Hypertension Father   . Cancer Paternal Aunt     breast  . Alcohol abuse Paternal Grandfather   . Cancer Cousin     breast   PE: BP 124/76 mmHg  Pulse 84  Temp(Src) 98.4 F (36.9 C) (Oral)  Resp 12  Wt  158 lb 9.6 oz (71.94 kg)  SpO2 96% Body mass index is 31.22 kg/(m^2). Wt Readings from Last 3 Encounters:  01/25/15 158 lb 9.6 oz (71.94 kg)  12/15/14 159 lb (72.122 kg)  11/05/14 159 lb (72.122 kg)   Constitutional: overweight, in NAD Eyes: PERRLA, EOMI, no exophthalmos ENT: moist mucous membranes, no thyromegaly, no cervical lymphadenopathy Cardiovascular: RRR, No MRG  Respiratory: CTA B Gastrointestinal: abdomen soft, NT, ND, BS+ Musculoskeletal: no deformities, strength intact in all 4 Skin: moist, warm, no rashes Neurological: no tremor with outstretched hands, DTR normal in all 4  ASSESSMENT: 1. DM2, insulin-dependent, uncontrolled, without complications  PLAN:  1. Patient with long-standing, uncontrolled diabetes, on a regimen of basal insulin (Toujeo) + JanuMet + added mealtime insulin at last visit. >> Her sugars are still greatly fluctuating, but improved. Will increase mealtime insulin as sugars after meals are higher and will decrease the Toujeo as she can have low CBGs b/w meals or if she is more active. Wiill need to repeat a C peptide as it was undetectable at last visit, but CBG 250 today >> cannot check. - We discussed about options for treatment, and I suggested to:    Patient Instructions  Please decrease the Toujeo to 25 units at bedtime.  Please continue: - JanuMet XR 50-1000 mg 2x a day - NovoLog 3x a day, 15 min before a meal  Insulin to carb ratio: 1:10  Please add a Novolog Sliding scale:  - 150-175: + 1 unit  - 176-200: + 2 units  - 201-225: + 3 units  - 226-250: + 4 units  - 251-275: + 5 units - 276-300: + 6 units  Please stop at the lab.  Please come back for a follow-up appointment in 3 months  - check Hba1c - UTD with eye exams - continue checking sugars at different times of the day - check 3 times a day, rotating checks - Return to clinic in 3 mo with sugar log   Office Visit on 01/25/2015  Component Date Value Ref Range Status  . Hgb  A1c MFr Bld 01/25/2015 8.8* 4.6 - 6.5 % Final   Glycemic Control Guidelines for People with Diabetes:Non Diabetic:  <6%Goal of Therapy: <7%Additional Action Suggested:  >8%    Hba1c a little better.

## 2015-01-25 NOTE — Patient Instructions (Signed)
Please decrease the Toujeo to 25 units at bedtime.  Please continue: - JanuMet XR 50-1000 mg 2x a day - NovoLog 3x a day, 15 min before a meal  Insulin to carb ratio: 1:10  Please add a Sliding scale:  - 150-175: + 1 unit  - 176-200: + 2 units  - 201-225: + 3 units  - 226-250: + 4 units  - 251-275: + 5 units - 276-300: + 6 units  Please stop at the lab.  Please come back for a follow-up appointment in 3 months

## 2015-01-26 MED ORDER — AMLODIPINE BESYLATE 5 MG PO TABS: 5.0000 mg | ORAL_TABLET | Freq: Every day | ORAL | Status: DC

## 2015-01-26 MED ORDER — SUMATRIPTAN SUCCINATE 100 MG PO TABS: 100.0000 mg | ORAL_TABLET | ORAL | Status: DC | PRN

## 2015-01-26 MED ORDER — ONETOUCH ULTRASOFT LANCETS MISC: 1.0000 | Status: DC | PRN

## 2015-01-26 NOTE — Telephone Encounter (Signed)
Pt last seen 10/11/14 and has no future appts. With PCP.  When should pt follow up in the office?

## 2015-01-26 NOTE — Telephone Encounter (Signed)
Pt scheduled follow up for 04/11/2015

## 2015-01-26 NOTE — Telephone Encounter (Signed)
Re-sent Rxs.  Please call pt to arrange f/u in 03/2015.

## 2015-01-26 NOTE — Telephone Encounter (Signed)
Ok to sed 3 month supply.  Follow up in August pls

## 2015-02-08 ENCOUNTER — Inpatient Hospital Stay (HOSPITAL_BASED_OUTPATIENT_CLINIC_OR_DEPARTMENT_OTHER): Admission: RE | Admit: 2015-02-08 | Payer: BLUE CROSS/BLUE SHIELD | Source: Ambulatory Visit

## 2015-02-11 ENCOUNTER — Ambulatory Visit (HOSPITAL_BASED_OUTPATIENT_CLINIC_OR_DEPARTMENT_OTHER)
Admission: RE | Admit: 2015-02-11 | Discharge: 2015-02-11 | Disposition: A | Discharge status: Home / Self Care | Payer: BLUE CROSS/BLUE SHIELD | Source: Ambulatory Visit | Attending: Family | Admitting: Family

## 2015-02-11 DIAGNOSIS — Z1231 Encounter for screening mammogram for malignant neoplasm of breast: Secondary | ICD-10-CM | POA: Diagnosis present

## 2015-02-11 DIAGNOSIS — Z0000 Encounter for general adult medical examination without abnormal findings: Secondary | ICD-10-CM

## 2015-03-10 ENCOUNTER — Other Ambulatory Visit: Payer: Self-pay | Admitting: Family

## 2015-03-11 ENCOUNTER — Other Ambulatory Visit: Payer: Self-pay | Admitting: *Deleted

## 2015-03-11 ENCOUNTER — Other Ambulatory Visit: Payer: Self-pay | Admitting: Internal Medicine

## 2015-03-11 ENCOUNTER — Telehealth: Payer: Self-pay | Admitting: Internal Medicine

## 2015-03-11 MED ORDER — INSULIN GLARGINE 300 UNIT/ML ~~LOC~~ SOPN: 25.0000 [IU] | PEN_INJECTOR | Freq: Every day | SUBCUTANEOUS | Status: DC

## 2015-03-11 MED ORDER — AMLODIPINE BESYLATE 5 MG PO TABS: 5.0000 mg | ORAL_TABLET | Freq: Every day | ORAL | Status: DC

## 2015-03-11 NOTE — Telephone Encounter (Signed)
Patient called and would like a refill on her Rx   Rx: Toujeo   Pharmacy: [facility]   Thank you

## 2015-03-11 NOTE — Telephone Encounter (Signed)
Done

## 2015-03-11 NOTE — Addendum Note (Signed)
Addended by: Mervin Kung A on: 03/11/2015 07:35 AM   Modules accepted: Orders

## 2015-03-22 ENCOUNTER — Ambulatory Visit (INDEPENDENT_AMBULATORY_CARE_PROVIDER_SITE_OTHER): Payer: BLUE CROSS/BLUE SHIELD | Admitting: Family

## 2015-03-22 ENCOUNTER — Encounter: Payer: Self-pay | Admitting: Family

## 2015-03-22 ENCOUNTER — Encounter: Payer: Self-pay | Admitting: Internal Medicine

## 2015-03-22 VITALS — BP 118/88 | HR 90 | Temp 98.1°F | Resp 18 | Ht 61.0 in | Wt 159.0 lb

## 2015-03-22 DIAGNOSIS — G43909 Migraine, unspecified, not intractable, without status migrainosus: Secondary | ICD-10-CM | POA: Diagnosis not present

## 2015-03-22 DIAGNOSIS — I10 Essential (primary) hypertension: Secondary | ICD-10-CM

## 2015-03-22 DIAGNOSIS — Z23 Encounter for immunization: Secondary | ICD-10-CM | POA: Diagnosis not present

## 2015-03-22 DIAGNOSIS — E119 Type 2 diabetes mellitus without complications: Secondary | ICD-10-CM | POA: Diagnosis not present

## 2015-03-22 LAB — BASIC METABOLIC PANEL
BUN: 12 mg/dL (ref 6–23)
CHLORIDE: 102 meq/L (ref 96–112)
CO2: 27 meq/L (ref 19–32)
Calcium: 9.6 mg/dL (ref 8.4–10.5)
Creatinine, Ser: 0.64 mg/dL (ref 0.40–1.20)
GFR: 103.07 mL/min (ref >60.00)
Glucose, Bld: 216 mg/dL — ABNORMAL HIGH (ref 70–99)
POTASSIUM: 4.1 meq/L (ref 3.5–5.1)
Sodium: 137 mEq/L (ref 135–145)

## 2015-03-22 NOTE — Addendum Note (Signed)
Addended by: Neldon Labella on: 03/22/2015 09:07 AM   Modules accepted: Orders

## 2015-03-22 NOTE — Assessment & Plan Note (Signed)
Blood pressure stable. Continue current meds.  Obtain bmet.

## 2015-03-22 NOTE — Progress Notes (Signed)
Subjective:    Patient ID: Kathryn Rogers, female    DOB: [birth year], 54 y.o.   MRN: [id number]  HPI  Kathryn Rogers is a 54 yr old female who presents today for follow up.   DM2- she is currently on toujeo, novolog, janumet.  She follows with Endo Elvera Lennox)  Lab Results  Component Value Date   HGBA1C 8.8* 01/25/2015   HGBA1C 9.1* 10/11/2014   Lab Results  Component Value Date   MICROALBUR 0.7 10/11/2014   LDLCALC 78 10/11/2014   CREATININE 0.59 10/11/2014   HTN-   BP Readings from Last 3 Encounters:  01/25/15 124/76  12/15/14 132/90  11/05/14 122/78   Migraines- pt is maintained on prn imitrex.  Last visit we gave her trial of HS elavil.  Reports that she took for one month. She reports that she felt spacey and weird, no improvement in migraines.  Stopped elavil.  Reports at least one migraine a week.   Review of Systems See HPI  Past Medical History  Diagnosis Date  . Migraine   . Diabetes mellitus without complication   . Hypertension   . History of rheumatic fever     age 73  . Hyperlipidemia   . Diverticulosis     History   Social History  . Marital Status: Married    Spouse Name: N/A  . Number of Children: N/A  . Years of Education: N/A   Occupational History  . Not on file.   Social History Main Topics  . Smoking status: Never Smoker   . Smokeless tobacco: Never Used  . Alcohol Use: No  . Drug Use: No  . Sexual Activity: Not on file   Other Topics Concern  . Not on file   Social History Narrative   Moved from [place] 2016, originally from [facility] (parents/sisters in [place])   Homemaker   Completed 12th grade   2 daughter (6 grandchildren) one grandchild lives with her.   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    Past Surgical History  Procedure Laterality Date  . Cholecystectomy  1993  . Abdominal hysterectomy  1991  . Lumbar disc surgery  2004   L5, discectomy and fusion  . Cesarean section      x 2    Family History  Problem Relation Age of Onset  . Hypertension Father   . Cancer Paternal Aunt     breast  . Alcohol abuse Paternal Grandfather   . Cancer Cousin     breast    Allergies  Allergen Reactions  . Codeine Other (See Comments)    Dizziness, n/v, head itches    Current Outpatient Prescriptions on File Prior to Visit  Medication Sig Dispense Refill  . amLODipine (NORVASC) 5 MG tablet Take 1 tablet (5 mg total) by mouth daily. 90 tablet 0  . aspirin EC 81 MG tablet Take 81 mg by mouth daily.    . fluticasone (FLONASE) 50 MCG/ACT nasal spray As needed  0  . glucose blood test strip Use as instructed to check blood sugar 3 times daily.    . insulin aspart (NOVOLOG FLEXPEN) 100 UNIT/ML FlexPen Inject 3-5 Units into the skin 3 (three) times daily with meals. 15 mL 1  . Insulin Glargine (TOUJEO SOLOSTAR) 300 UNIT/ML SOPN Inject 25 Units into the skin daily before breakfast. 3 pen 2  . Insulin Pen  Needle 31G X 8 MM MISC by Does not apply route. Use to give insulin injections 3-4 times daily    . JANUMET XR 50-1000 MG TB24 Take 1 tablet by mouth 2 (two) times daily. 60 tablet 2  . Lancets (ONETOUCH ULTRASOFT) lancets 1 each by Other route as needed for other. Use as instructed to check blood sugar 4 times a day. 400 each 0  . promethazine (PHENERGAN) 25 MG tablet Take 25 mg by mouth every 6 (six) hours as needed for nausea or vomiting (migraine).    . SUMAtriptan (IMITREX) 100 MG tablet Take 1 tablet (100 mg total) by mouth as needed. 10 tablet 2  . SUMAtriptan (IMITREX) 20 MG/ACT nasal spray Use 1 dose in the nostril as needed for migraine  5  . TOUJEO SOLOSTAR 300 UNIT/ML SOPN INJECT 24 UNITS SUBCUTANEOUSLY ONCE DAILY BEFORE BREAKFAST 6 pen 2   No current facility-administered medications on file prior to visit.    BP 118/88 mmHg  Pulse 90  Temp(Src) 98.1 F (36.7 C) (Oral)  Resp 18  Ht  (1.549 m)  Wt 159  lb (72.122 kg)  BMI 30.06 kg/m2  SpO2 98%        Objective:   Physical Exam  Constitutional: She appears well-developed and well-nourished.  Cardiovascular: Normal rate, regular rhythm and normal heart sounds.   No murmur heard. Pulmonary/Chest: Effort normal and breath sounds normal. No respiratory distress. She has no wheezes.  Psychiatric: She has a normal mood and affect. Her behavior is normal. Judgment and thought content normal.          Assessment & Plan:

## 2015-03-22 NOTE — Patient Instructions (Signed)
You will be contacted about your referral to neurology. Please complete lab work prior to leaving. Follow up in 6 months.

## 2015-03-22 NOTE — Assessment & Plan Note (Signed)
Uncontrolled. Refer to neurology for further evaluation.  

## 2015-03-22 NOTE — Assessment & Plan Note (Signed)
Stable, management per endo.  

## 2015-03-22 NOTE — Progress Notes (Signed)
Pre visit review using our clinic review tool, if applicable. No additional management support is needed unless otherwise documented below in the visit note. 

## 2015-03-23 ENCOUNTER — Encounter: Payer: Self-pay | Admitting: Family

## 2015-03-29 ENCOUNTER — Ambulatory Visit: Payer: BLUE CROSS/BLUE SHIELD | Admitting: Neurology

## 2015-04-01 ENCOUNTER — Other Ambulatory Visit: Payer: Self-pay | Admitting: Internal Medicine

## 2015-04-01 ENCOUNTER — Ambulatory Visit (INDEPENDENT_AMBULATORY_CARE_PROVIDER_SITE_OTHER): Payer: BLUE CROSS/BLUE SHIELD | Admitting: Family

## 2015-04-01 ENCOUNTER — Other Ambulatory Visit: Payer: Self-pay | Admitting: Family

## 2015-04-01 ENCOUNTER — Encounter: Payer: Self-pay | Admitting: Family

## 2015-04-01 ENCOUNTER — Ambulatory Visit (HOSPITAL_BASED_OUTPATIENT_CLINIC_OR_DEPARTMENT_OTHER)
Admission: RE | Admit: 2015-04-01 | Discharge: 2015-04-01 | Disposition: A | Discharge status: Home / Self Care | Payer: BLUE CROSS/BLUE SHIELD | Source: Ambulatory Visit | Attending: Family | Admitting: Family

## 2015-04-01 ENCOUNTER — Other Ambulatory Visit (INDEPENDENT_AMBULATORY_CARE_PROVIDER_SITE_OTHER): Payer: BLUE CROSS/BLUE SHIELD

## 2015-04-01 ENCOUNTER — Telehealth: Payer: Self-pay | Admitting: Family

## 2015-04-01 VITALS — BP 110/78 | HR 105 | Temp 98.4°F | Ht 60.0 in | Wt 154.5 lb

## 2015-04-01 DIAGNOSIS — R5081 Fever presenting with conditions classified elsewhere: Secondary | ICD-10-CM | POA: Diagnosis not present

## 2015-04-01 DIAGNOSIS — R509 Fever, unspecified: Secondary | ICD-10-CM

## 2015-04-01 DIAGNOSIS — D509 Iron deficiency anemia, unspecified: Secondary | ICD-10-CM

## 2015-04-01 DIAGNOSIS — R05 Cough: Secondary | ICD-10-CM | POA: Diagnosis present

## 2015-04-01 DIAGNOSIS — R918 Other nonspecific abnormal finding of lung field: Secondary | ICD-10-CM | POA: Insufficient documentation

## 2015-04-01 DIAGNOSIS — G43909 Migraine, unspecified, not intractable, without status migrainosus: Secondary | ICD-10-CM

## 2015-04-01 DIAGNOSIS — D649 Anemia, unspecified: Secondary | ICD-10-CM

## 2015-04-01 LAB — CBC WITH DIFFERENTIAL/PLATELET
BASOS ABS: 0 10*3/uL (ref 0.0–0.1)
Basophils Relative: 0.4 % (ref 0.0–3.0)
EOS ABS: 0.1 10*3/uL (ref 0.0–0.7)
Eosinophils Relative: 1.1 % (ref 0.0–5.0)
HCT: 34.8 % — ABNORMAL LOW (ref 36.0–46.0)
Hemoglobin: 11 g/dL — ABNORMAL LOW (ref 12.0–15.0)
LYMPHS PCT: 15.7 % (ref 12.0–46.0)
Lymphs Abs: 1.7 10*3/uL (ref 0.7–4.0)
MCHC: 31.6 g/dL (ref 30.0–36.0)
MCV: 71.6 fl — AB (ref 78.0–100.0)
MONO ABS: 0.6 10*3/uL (ref 0.1–1.0)
Monocytes Relative: 5.5 % (ref 3.0–12.0)
NEUTROS ABS: 8.6 10*3/uL — AB (ref 1.4–7.7)
Neutrophils Relative %: 77.3 % — ABNORMAL HIGH (ref 43.0–77.0)
Platelets: 397 10*3/uL (ref 150.0–400.0)
RBC: 4.86 Mil/uL (ref 3.87–5.11)
RDW: 16 % — ABNORMAL HIGH (ref 11.5–15.5)
WBC: 11.1 10*3/uL — AB (ref 4.0–10.5)

## 2015-04-01 LAB — POCT URINALYSIS DIPSTICK
Bilirubin, UA: NEGATIVE
LEUKOCYTES UA: NEGATIVE
NITRITE UA: NEGATIVE
PH UA: 5.5
RBC UA: NEGATIVE
Spec Grav, UA: 1.025
Urobilinogen, UA: 4

## 2015-04-01 LAB — HEPATIC FUNCTION PANEL
ALBUMIN: 3.9 g/dL (ref 3.5–5.2)
ALT: 10 U/L (ref 0–35)
AST: 9 U/L (ref 0–37)
Alkaline Phosphatase: 65 U/L (ref 39–117)
BILIRUBIN TOTAL: 0.3 mg/dL (ref 0.2–1.2)
Bilirubin, Direct: 0.1 mg/dL (ref 0.0–0.3)
Total Protein: 7.6 g/dL (ref 6.0–8.3)

## 2015-04-01 LAB — LIPASE: LIPASE: 6 U/L — AB (ref 11.0–59.0)

## 2015-04-01 LAB — IRON: Iron: 21 ug/dL — ABNORMAL LOW (ref 42–145)

## 2015-04-01 MED ORDER — KETOROLAC TROMETHAMINE 60 MG/2ML IM SOLN
60.0000 mg | Freq: Once | INTRAMUSCULAR | Status: AC
  Administered 2015-04-01: 60 mg via INTRAMUSCULAR

## 2015-04-01 MED ORDER — FERROUS SULFATE 325 (65 FE) MG PO TABS: 325.0000 mg | ORAL_TABLET | Freq: Two times a day (BID) | ORAL | Status: DC

## 2015-04-01 MED ORDER — MOXIFLOXACIN HCL 400 MG PO TABS: 400.0000 mg | ORAL_TABLET | Freq: Every day | ORAL | Status: DC

## 2015-04-01 MED ORDER — ONDANSETRON 4 MG PO TBDP: 4.0000 mg | ORAL_TABLET | Freq: Three times a day (TID) | ORAL | Status: DC | PRN

## 2015-04-01 NOTE — Assessment & Plan Note (Signed)
UA unremarkable. Will send for culture.  Will also obtain CXR to rule out pneumonia. CBC, LFT, lipase due to back pain.

## 2015-04-01 NOTE — Telephone Encounter (Signed)
Patient received VM and stated NP wanted her to call office and check MyChart message

## 2015-04-01 NOTE — Progress Notes (Signed)
Pre visit review using our clinic review tool, if applicable. No additional management support is needed unless otherwise documented below in the visit note. 

## 2015-04-01 NOTE — Telephone Encounter (Signed)
Pt notified and made aware. She states that she has already pick up Avelox and has taken on dose.  She has also read email regarding iron.  States she will pick up stool kit on Monday.  No further needs or concerns voiced.

## 2015-04-01 NOTE — Telephone Encounter (Signed)
CXR positive for pneumonia. Will initiate avelox. Left message on answering machine- advised pt to check mychart.

## 2015-04-01 NOTE — Addendum Note (Signed)
Addended by: Neldon Labella on: 04/01/2015 09:17 AM   Modules accepted: Orders

## 2015-04-01 NOTE — Patient Instructions (Addendum)
Please complete lab work prior to leaving. Complete chest x ray on the first floor. You may use tylenol or motrin as needed for fever. Please call if symptoms worsen or if fever does not resolve in 3 days.

## 2015-04-01 NOTE — Progress Notes (Signed)
Subjective:    Patient ID: Kathryn Rogers, female    DOB: May 25, 1961, 54 y.o.   MRN: [id number]  HPI   Kathryn Rogers is a 54 yr old female who presents today with chief complaint of fever.  Reports that Monday afternoon she had temp 102.  Has been up and down.  102 again yesterday AM.  This AM no fever. Reports 3 day hx of HA.  Had vomiting wed night due to migraine. Right low back pain.  Has hx of spinal fusion.  She also reports cough- occurs with deep breath.  Denies nasal congestion or sore throat. Denies myalgia.      Review of Systems See HPI  Past Medical History  Diagnosis Date  . Migraine   . Diabetes mellitus without complication   . Hypertension   . History of rheumatic fever     age 6  . Hyperlipidemia   . Diverticulosis     Social History   Social History  . Marital Status: Married    Spouse Name: N/A  . Number of Children: N/A  . Years of Education: N/A   Occupational History  . Not on file.   Social History Main Topics  . Smoking status: Never Smoker   . Smokeless tobacco: Never Used  . Alcohol Use: No  . Drug Use: No  . Sexual Activity: Not on file   Other Topics Concern  . Not on file   Social History Narrative   Moved from [place] 2016, originally from [facility] (parents/sisters in [place])   Homemaker   Completed 12th grade   2 daughter (6 grandchildren) one grandchild lives with her.   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    Past Surgical History  Procedure Laterality Date  . Cholecystectomy  1993  . Abdominal hysterectomy  1991  . Lumbar disc surgery  2004    L5, discectomy and fusion  . Cesarean section      x 2    Family History  Problem Relation Age of Onset  . Hypertension Father   . Cancer Paternal Aunt     breast  . Alcohol abuse Paternal Grandfather   . Cancer Cousin     breast    Allergies  Allergen Reactions  . Codeine Other  (See Comments)    Dizziness, n/v, head itches    Current Outpatient Prescriptions on File Prior to Visit  Medication Sig Dispense Refill  . amLODipine (NORVASC) 5 MG tablet Take 1 tablet (5 mg total) by mouth daily. 90 tablet 0  . aspirin EC 81 MG tablet Take 81 mg by mouth daily.    . fluticasone (FLONASE) 50 MCG/ACT nasal spray As needed  0  . glucose blood test strip Use as instructed to check blood sugar 3 times daily.    . insulin aspart (NOVOLOG FLEXPEN) 100 UNIT/ML FlexPen Inject 3-5 Units into the skin 3 (three) times daily with meals. 15 mL 1  . Insulin Glargine (TOUJEO SOLOSTAR) 300 UNIT/ML SOPN Inject 25 Units into the skin daily before breakfast. 3 pen 2  . Insulin Pen Needle 31G X 8 MM MISC by Does not apply route. Use to give insulin injections 3-4 times daily    . JANUMET XR 50-1000 MG TB24 Take 1 tablet by mouth 2 (two) times daily. 60 tablet 2  . Lancets (ONETOUCH ULTRASOFT) lancets 1 each by Other  route as needed for other. Use as instructed to check blood sugar 4 times a day. 400 each 0  . promethazine (PHENERGAN) 25 MG tablet Take 25 mg by mouth every 6 (six) hours as needed for nausea or vomiting (migraine).    . SUMAtriptan (IMITREX) 100 MG tablet Take 1 tablet (100 mg total) by mouth as needed. 10 tablet 2  . SUMAtriptan (IMITREX) 20 MG/ACT nasal spray Use 1 dose in the nostril as needed for migraine  5  . TOUJEO SOLOSTAR 300 UNIT/ML SOPN INJECT 24 UNITS SUBCUTANEOUSLY ONCE DAILY BEFORE BREAKFAST 6 pen 2   No current facility-administered medications on file prior to visit.    BP 110/78 mmHg  Pulse 105  Temp(Src) 98.4 F (36.9 C) (Oral)  Ht 5' (1.524 m)  Wt 154 lb 8 oz (70.081 kg)  BMI 30.17 kg/m2  SpO2 98%       Objective:   Physical Exam  Constitutional: She is oriented to person, place, and time. She appears well-developed and well-nourished.  HENT:  Head: Normocephalic and atraumatic.  Cardiovascular: Normal rate, regular rhythm and normal heart  sounds.   No murmur heard. Pulmonary/Chest: Effort normal and breath sounds normal. No respiratory distress. She has no wheezes.  Neurological: She is alert and oriented to person, place, and time.  Psychiatric: She has a normal mood and affect. Her behavior is normal. Judgment and thought content normal.          Assessment & Plan:

## 2015-04-01 NOTE — Addendum Note (Signed)
Addended by: Scharlene Gloss B on: 04/01/2015 08:16 AM   Modules accepted: Orders

## 2015-04-01 NOTE — Assessment & Plan Note (Signed)
IM toradol given. Rx zofran prn nausea since she tells me that phenergan "causes a headache."

## 2015-04-04 ENCOUNTER — Telehealth: Payer: Self-pay | Admitting: Family

## 2015-04-04 LAB — CULTURE, URINE COMPREHENSIVE: Colony Count: 10000

## 2015-04-04 NOTE — Telephone Encounter (Signed)
Spoke to pt to follow up. Advised her that Urine + for bacteria.  She continues avelox for PNA which should cover her UTI. Reports that she did "cough up a small amount of blood over the weekend."  Will bring patient back in Friday AM at 7AM for follow up.  She denies fever.

## 2015-04-04 NOTE — Telephone Encounter (Signed)
IFOB kit placed at front desk for pick up. Future order was already placed on 04/01/15.

## 2015-04-08 ENCOUNTER — Ambulatory Visit (INDEPENDENT_AMBULATORY_CARE_PROVIDER_SITE_OTHER): Payer: BLUE CROSS/BLUE SHIELD | Admitting: Family

## 2015-04-08 ENCOUNTER — Ambulatory Visit (HOSPITAL_BASED_OUTPATIENT_CLINIC_OR_DEPARTMENT_OTHER)
Admission: RE | Admit: 2015-04-08 | Discharge: 2015-04-08 | Disposition: A | Discharge status: Home / Self Care | Payer: BLUE CROSS/BLUE SHIELD | Source: Ambulatory Visit | Attending: Family | Admitting: Family

## 2015-04-08 ENCOUNTER — Telehealth: Payer: Self-pay | Admitting: Family

## 2015-04-08 ENCOUNTER — Encounter: Payer: Self-pay | Admitting: Family

## 2015-04-08 VITALS — BP 124/84 | HR 79 | Temp 97.7°F | Resp 16 | Ht 60.0 in | Wt 160.8 lb

## 2015-04-08 DIAGNOSIS — J189 Pneumonia, unspecified organism: Secondary | ICD-10-CM

## 2015-04-08 MED ORDER — ONDANSETRON 4 MG PO TBDP: 4.0000 mg | ORAL_TABLET | Freq: Three times a day (TID) | ORAL | Status: DC | PRN

## 2015-04-08 NOTE — Patient Instructions (Addendum)
Please complete chest x ray prior to leaving. Call if your cough worsens or does continue to not improve.  Please schedule a complete physical at the front desk.

## 2015-04-08 NOTE — Telephone Encounter (Signed)
Spoke with pt and she voices understanding. Pt will call to schedule an appt.

## 2015-04-08 NOTE — Telephone Encounter (Signed)
Pneumonia appears improved on CXR- not completely resolved. Advise pt to repeat cxr in 3 weeks please. I have pended order.

## 2015-04-08 NOTE — Progress Notes (Signed)
Pre visit review using our clinic review tool, if applicable. No additional management support is needed unless otherwise documented below in the visit note. 

## 2015-04-08 NOTE — Progress Notes (Signed)
Subjective:    Patient ID: Kathryn Rogers, female    DOB: 1960-11-17, 54 y.o.   MRN: [id number]  HPI  Kathryn Rogers is a 54 yr old female who presents today for follow up of pneumonia.  She was seen on 04/01/15 with report of fever and cough.  CXR revealed PNA.   Pt was placed on avelox.  Urine culture grew 10K of klebsiella which was sensitive to fluoroquinolones.  Cough is improved.  No recent fever.  Energy is improving. Denies dysuria.    Review of Systems See HPI  Past Medical History  Diagnosis Date  . Migraine   . Diabetes mellitus without complication   . Hypertension   . History of rheumatic fever     age 89  . Hyperlipidemia   . Diverticulosis     Social History   Social History  . Marital Status: Married    Spouse Name: N/A  . Number of Children: N/A  . Years of Education: N/A   Occupational History  . Not on file.   Social History Main Topics  . Smoking status: Never Smoker   . Smokeless tobacco: Never Used  . Alcohol Use: No  . Drug Use: No  . Sexual Activity: Not on file   Other Topics Concern  . Not on file   Social History Narrative   Moved from [place] 2016, originally from [facility] (parents/sisters in [place])   Homemaker   Completed 12th grade   2 daughter (6 grandchildren) one grandchild lives with her.   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    Past Surgical History  Procedure Laterality Date  . Cholecystectomy  1993  . Abdominal hysterectomy  1991  . Lumbar disc surgery  2004    L5, discectomy and fusion  . Cesarean section      x 2    Family History  Problem Relation Age of Onset  . Hypertension Father   . Cancer Paternal Aunt     breast  . Alcohol abuse Paternal Grandfather   . Cancer Cousin     breast    Allergies  Allergen Reactions  . Codeine Other (See Comments)    Dizziness, n/v, head itches    Current Outpatient Prescriptions on  File Prior to Visit  Medication Sig Dispense Refill  . amLODipine (NORVASC) 5 MG tablet Take 1 tablet (5 mg total) by mouth daily. 90 tablet 0  . aspirin EC 81 MG tablet Take 81 mg by mouth daily.    . ferrous sulfate 325 (65 FE) MG tablet Take 1 tablet (325 mg total) by mouth 2 (two) times daily with a meal.  3  . fluticasone (FLONASE) 50 MCG/ACT nasal spray As needed  0  . glucose blood test strip Use as instructed to check blood sugar 3 times daily.    . insulin aspart (NOVOLOG FLEXPEN) 100 UNIT/ML FlexPen Inject 3-5 Units into the skin 3 (three) times daily with meals. 15 mL 1  . Insulin Glargine (TOUJEO SOLOSTAR) 300 UNIT/ML SOPN Inject 25 Units into the skin daily before breakfast. 3 pen 2  . Insulin Pen Needle 31G X 8 MM MISC by Does not apply route. Use to give insulin injections 3-4 times daily    . JANUMET XR 50-1000 MG TB24 TAKE ONE TABLET BY MOUTH TWICE DAILY 60 tablet 1  . Lancets (ONETOUCH ULTRASOFT) lancets 1 each  by Other route as needed for other. Use as instructed to check blood sugar 4 times a day. 400 each 0  . ondansetron (ZOFRAN ODT) 4 MG disintegrating tablet Take 1 tablet (4 mg total) by mouth every 8 (eight) hours as needed for nausea or vomiting. 20 tablet 0  . SUMAtriptan (IMITREX) 100 MG tablet Take 1 tablet (100 mg total) by mouth as needed. 10 tablet 2  . SUMAtriptan (IMITREX) 20 MG/ACT nasal spray Use 1 dose in the nostril as needed for migraine  5  . TOUJEO SOLOSTAR 300 UNIT/ML SOPN INJECT 24 UNITS SUBCUTANEOUSLY ONCE DAILY BEFORE BREAKFAST 6 pen 2   No current facility-administered medications on file prior to visit.    BP 124/84 mmHg  Pulse 79  Temp(Src) 97.7 F (36.5 C) (Oral)  Resp 16  Ht 5' (1.524 m)  Wt 160 lb 12.8 oz (72.938 kg)  BMI 31.40 kg/m2  SpO2 99%       Objective:   Physical Exam  Constitutional: She is oriented to person, place, and time. She appears well-developed and well-nourished.  HENT:  Head: Normocephalic and atraumatic.    Cardiovascular: Normal rate, regular rhythm and normal heart sounds.   No murmur heard. Pulmonary/Chest: Effort normal and breath sounds normal. No respiratory distress. She has no wheezes.  Musculoskeletal: She exhibits no edema.  Neurological: She is alert and oriented to person, place, and time.  Psychiatric: She has a normal mood and affect. Her behavior is normal. Judgment and thought content normal.          Assessment & Plan:  Pneumonia-  Clinically improved.  Obtain follow up chest x ray.

## 2015-04-11 ENCOUNTER — Ambulatory Visit: Payer: BLUE CROSS/BLUE SHIELD | Admitting: Family

## 2015-04-27 ENCOUNTER — Encounter: Payer: Self-pay | Admitting: Internal Medicine

## 2015-04-27 ENCOUNTER — Ambulatory Visit (INDEPENDENT_AMBULATORY_CARE_PROVIDER_SITE_OTHER): Payer: BLUE CROSS/BLUE SHIELD | Admitting: Internal Medicine

## 2015-04-27 ENCOUNTER — Other Ambulatory Visit (INDEPENDENT_AMBULATORY_CARE_PROVIDER_SITE_OTHER): Payer: BLUE CROSS/BLUE SHIELD

## 2015-04-27 ENCOUNTER — Other Ambulatory Visit (INDEPENDENT_AMBULATORY_CARE_PROVIDER_SITE_OTHER): Payer: BLUE CROSS/BLUE SHIELD | Admitting: *Deleted

## 2015-04-27 VITALS — BP 128/88 | HR 94 | Temp 97.8°F | Resp 12 | Wt 162.0 lb

## 2015-04-27 DIAGNOSIS — E1065 Type 1 diabetes mellitus with hyperglycemia: Secondary | ICD-10-CM | POA: Diagnosis not present

## 2015-04-27 DIAGNOSIS — E119 Type 2 diabetes mellitus without complications: Secondary | ICD-10-CM

## 2015-04-27 LAB — POCT GLYCOSYLATED HEMOGLOBIN (HGB A1C): Hemoglobin A1C: 8

## 2015-04-27 LAB — GLUCOSE, FASTING: GLUCOSE, FASTING: 75 mg/dL (ref 65–99)

## 2015-04-27 NOTE — Patient Instructions (Signed)
Please continue: - JanuMet XR 50-1000 mg 2x a day  - Toujeo 25 units in am - NovoLog 3x a day, 15 min before a meal: 1:10 insulin to carb ratio - Novolog Sliding scale:  - 150-175: + 1 unit  - 176-200: + 2 units  - 201-225: + 3 units  - 226-250: + 4 units  - 251-275: + 5 units - 276-300: + 6 units  Please return in 3 months with your sugar log.   Please stop at the lab.

## 2015-04-27 NOTE — Progress Notes (Signed)
Patient ID: Kathryn Rogers, female   DOB: [birth year], 54 y.o.   MRN: [id number]  HPI: Kathryn Rogers is a 54 y.o.-year-old female, returning for management of DM2, dx in 2010, insulin-dependent since dx, uncontrolled, without complications. Last visit 1.5 mo ago.  She was on Amoxycillin for dental work >> high sugars: 400s.  She then had PNA >> sugars higher: 500s.  Last hemoglobin A1c was: Lab Results  Component Value Date   HGBA1C 8.8* 01/25/2015   HGBA1C 9.1* 10/11/2014  Before this, HbA1c was lower.  At last visit, we checked labs for DM1: negative ABs but undetectable C-pp: Component     Latest Ref Rng 12/15/2014  C-Peptide     0.80 - 3.90 ng/mL <0.05 (L)  Glucose, Fasting     70 - 99 mg/dL 119 (H)  Glutamic Acid Decarb Ab     <5 IU/mL <5  Pancreatic Islet Cell Antibody     < 5 JDF Units <5   Pt is on a regimen of: - JanuMet XR 50-1000 mg 2x a day - before b'fast and in the pm - Toujeo 25 >> 27 >> 25 units in am - NovoLog 3x a day, 15 min before a meal - restarted 11/2014: 1:10 ICR - Novolog Sliding scale:  - 150-175: + 1 unit  - 176-200: + 2 units  - 201-225: + 3 units  - 226-250: + 4 units  - 251-275: + 5 units - 276-300: + 6 units We stopped Glipizide 11/2014 when started NovoLog. Was on Novolog before - 3x a day, before meals - 1 Units per carb choice (15 g carbs), ave 3 units per meal Takes alphalipoic acid. Used to take Cinammon.  Pt checks her sugars 2x a day and they are: - am: 60, 110-115, 288 >> 39x1, 50, 71-220, 276, 298 >> 73, 81-213 >> 56, 62-153, 226 - 2h after b'fast: n/c >> 373 (forget meds that am) >> 179-294 - before lunch: n/c >> 167 >> 145-175, 320 >> 54, 269 - 2h after lunch: 135-343 (may take 2 units of insulin) >> 204-330 >>  79, 142-266 >> 43, 108, 167 - before dinner: 49 x1, 93-394 >> 176-233, 332 >> 48, 72-239 >> 48, 72-239 >> >> 52, 62-173, 241 - 2h after dinner: 107-273, 488x1 >> 129-279 >> 54, 74-285, 332 - bedtime: 48 x1 >> n/c >> 48,  92-216 - nighttime: n/c >> 193 + lows, but less than before. Lowest sugar was 40s; she has hypoglycemia awareness at 80.  Highest sugar was in the 300s  Glucometer: One Touch mini  Pt's meals are: - Breakfast: cereal or carnation instant b'fast - Lunch: grilled salmon, salad + apple - Dinner: grille chicken, grilled grilled, baked potatoes - Snacks: 2 maybe  She walks for exercise - 20 min 2x a day - 2 mi a day; + swimming.   - no CKD, last BUN/creatinine:  Lab Results  Component Value Date   BUN 12 03/22/2015   CREATININE 0.64 03/22/2015   - last set of lipids: Lab Results  Component Value Date   CHOL 169 10/11/2014   HDL 65.60 10/11/2014   LDLCALC 78 10/11/2014   TRIG 128.0 10/11/2014   CHOLHDL 3 10/11/2014   - last eye exam was in 12/2014. [facility]. No DR.  - no numbness and tingling in her feet.  ROS: Constitutional: no Weight loss, no  fatigue Eyes: no blurry vision, no xerophthalmia ENT: no sore throat, no nodules palpated in throat, no dysphagia/odynophagia, no hoarseness Cardiovascular:  no CP/SOB/palpitations/leg swelling Respiratory: no cough/SOB Gastrointestinal: no N/V/D/C Musculoskeletal: no muscle/joint aches Skin: no rashes Neurological: no tremors/numbness/tingling/dizziness  I reviewed pt's medications, allergies, PMH, social hx, family hx, and changes were documented in the history of present illness. Otherwise, unchanged from my initial visit note: Past Medical History  Diagnosis Date  . Migraine   . Diabetes mellitus without complication   . Hypertension   . History of rheumatic fever     age 54  . Hyperlipidemia   . Diverticulosis    Past Surgical History  Procedure Laterality Date  . Cholecystectomy  1993  . Abdominal hysterectomy  1991  . Lumbar disc surgery  2004    L5, discectomy and fusion  . Cesarean section      x 2   History   Social History  . Marital Status: Married    Spouse Name: N/A   Social History Main Topics   . Smoking status: Never Smoker   . Smokeless tobacco: Never Used  . Alcohol Use: No  . Drug Use: No   Social History Narrative   Moved from [place] 2016, originally from [facility] (parents/sisters in [place])   Homemaker   Completed 12th grade   2 daughter (6 grandchildren) one grandchild lives with her.   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   Current Outpatient Prescriptions on File Prior to Visit  Medication Sig Dispense Refill  . amLODipine (NORVASC) 5 MG tablet Take 1 tablet (5 mg total) by mouth daily. 90 tablet 0  . aspirin EC 81 MG tablet Take 81 mg by mouth daily.    . ferrous sulfate 325 (65 FE) MG tablet Take 1 tablet (325 mg total) by mouth 2 (two) times daily with a meal.  3  . fluticasone (FLONASE) 50 MCG/ACT nasal spray As needed  0  . glucose blood test strip Use as instructed to check blood sugar 3 times daily.    . insulin aspart (NOVOLOG FLEXPEN) 100 UNIT/ML FlexPen Inject 3-5 Units into the skin 3 (three) times daily with meals. 15 mL 1  . Insulin Glargine (TOUJEO SOLOSTAR) 300 UNIT/ML SOPN Inject 25 Units into the skin daily before breakfast. 3 pen 2  . Insulin Pen Needle 31G X 8 MM MISC by Does not apply route. Use to give insulin injections 3-4 times daily    . JANUMET XR 50-1000 MG TB24 TAKE ONE TABLET BY MOUTH TWICE DAILY 60 tablet 1  . Lancets (ONETOUCH ULTRASOFT) lancets 1 each by Other route as needed for other. Use as instructed to check blood sugar 4 times a day. 400 each 0  . ondansetron (ZOFRAN ODT) 4 MG disintegrating tablet Take 1 tablet (4 mg total) by mouth every 8 (eight) hours as needed for nausea or vomiting. 20 tablet 0  . SUMAtriptan (IMITREX) 100 MG tablet Take 1 tablet (100 mg total) by mouth as needed. 10 tablet 2  . SUMAtriptan (IMITREX) 20 MG/ACT nasal spray Use 1 dose in the nostril as needed for migraine  5  . TOUJEO SOLOSTAR 300 UNIT/ML SOPN INJECT 24  UNITS SUBCUTANEOUSLY ONCE DAILY BEFORE BREAKFAST 6 pen 2   No current facility-administered medications on file prior to visit.   Allergies  Allergen Reactions  . Codeine Other (See Comments)    Dizziness, n/v, head itches   Family History  Problem Relation Age of Onset  . Hypertension Father   .  Cancer Paternal Aunt     breast  . Alcohol abuse Paternal Grandfather   . Cancer Cousin     breast   PE: BP 128/88 mmHg  Pulse 94  Temp(Src) 97.8 F (36.6 C) (Oral)  Resp 12  Wt 162 lb (73.483 kg)  SpO2 96% Body mass index is 31.64 kg/(m^2). Wt Readings from Last 3 Encounters:  04/08/15 160 lb 12.8 oz (72.938 kg)  04/01/15 154 lb 8 oz (70.081 kg)  03/22/15 159 lb (72.122 kg)   Constitutional: overweight, in NAD Eyes: PERRLA, EOMI, no exophthalmos ENT: moist mucous membranes, no thyromegaly, no cervical lymphadenopathy Cardiovascular: RRR, No MRG Respiratory: CTA B Gastrointestinal: abdomen soft, NT, ND, BS+ Musculoskeletal: no deformities, strength intact in all 4 Skin: moist, warm, no rashes Neurological: no tremor with outstretched hands, DTR normal in all 4  ASSESSMENT: 1. DM2, insulin-dependent, uncontrolled, without complications  PLAN:  1. Patient with long-standing, uncontrolled diabetes, on a regimen of basal insulin (Toujeo) + JanuMet + added mealtime insulin at last visit. >> Her sugars are still greatly fluctuating, especially with her PNA and ABx for dental procedure. She skipped JanuMet when she was sick >> sugars much higher. Will continue this for now. As sugar today in the office = 93, will check C peptide and fasting Glu - We discussed about options for treatment, and I suggested to continue current regimen:    Patient Instructions  Please decrease the Toujeo to 25 units in am  Please continue: - JanuMet XR 50-1000 mg 2x a day - NovoLog 3x a day, 15 min before a meal  Insulin to carb ratio: 1:10  Please add a Novolog Sliding scale:  - 150-175: + 1  unit  - 176-200: + 2 units  - 201-225: + 3 units  - 226-250: + 4 units  - 251-275: + 5 units - 276-300: + 6 units  Please come back for a follow-up appointment in 3 months  - check Hba1c today >> 8.0 (better!) - UTD with eye exams - given new log - continue checking sugars at different times of the day - check 3 times a day, rotating checks - Return to clinic in 3 mo with sugar log   Orders Placed This Encounter  Procedures  . C-peptide  . Glucose, Fasting   Lab on 04/27/2015  Component Date Value Ref Range Status  . C-Peptide 04/27/2015 <0.05* 0.80 - 3.90 ng/mL Final  . Glucose, Fasting 04/27/2015 75  65 - 99 mg/dL Final   Comment:     < 100  mg/dL = normal fasting glucose 100-125  mg/dL = IFG (impaired fasting glucose)   > 125  mg/dL = provisional diagnosis of diabetes   ** Please note change in unit of measure and reference range(s). **     Orders Only on 04/27/2015  Component Date Value Ref Range Status  . Hemoglobin A1C 04/27/2015 8.0   Final   C peptide again very low >> will change her dx to DM1.  Msg sent: Dear Ms Shirah, C peptide is still very low... Please continue same regimen for now as the sugars increased before when we stopped the JanuMet.  Sincerely, Carlus Pavlov MD

## 2015-04-28 DIAGNOSIS — E1065 Type 1 diabetes mellitus with hyperglycemia: Secondary | ICD-10-CM | POA: Insufficient documentation

## 2015-04-28 LAB — C-PEPTIDE

## 2015-05-05 ENCOUNTER — Other Ambulatory Visit: Payer: Self-pay | Admitting: Family

## 2015-05-06 ENCOUNTER — Other Ambulatory Visit: Payer: Self-pay

## 2015-05-06 MED ORDER — INSULIN ASPART 100 UNIT/ML FLEXPEN
3.0000 [IU] | PEN_INJECTOR | Freq: Three times a day (TID) | SUBCUTANEOUS | Status: DC
Start: 2015-05-06 — End: 2015-07-28

## 2015-05-10 ENCOUNTER — Other Ambulatory Visit: Payer: Self-pay | Admitting: *Deleted

## 2015-05-10 MED ORDER — GLUCOSE BLOOD VI STRP: ORAL_STRIP | Status: DC

## 2015-05-16 ENCOUNTER — Other Ambulatory Visit: Payer: Self-pay | Admitting: *Deleted

## 2015-05-16 ENCOUNTER — Other Ambulatory Visit: Payer: Self-pay | Admitting: Family

## 2015-05-16 MED ORDER — INSULIN PEN NEEDLE 31G X 8 MM MISC
Status: DC
Start: 2015-05-16 — End: 2016-02-07

## 2015-05-20 ENCOUNTER — Other Ambulatory Visit: Payer: Self-pay | Admitting: Family

## 2015-05-21 ENCOUNTER — Other Ambulatory Visit: Payer: Self-pay | Admitting: Family

## 2015-05-23 ENCOUNTER — Other Ambulatory Visit: Payer: Self-pay | Admitting: Family

## 2015-05-23 MED ORDER — SUMATRIPTAN 20 MG/ACT NA SOLN: NASAL | Status: DC

## 2015-05-23 MED ORDER — AMLODIPINE BESYLATE 5 MG PO TABS: 5.0000 mg | ORAL_TABLET | Freq: Every day | ORAL | Status: DC

## 2015-05-23 NOTE — Telephone Encounter (Signed)
Received refill requests from pharmacy for imitrex nasal spray and tablets. Spoke with pt, she prefers tablets as they are cheaper. Rx sent to pharmacy for tablets. Nasal spray had already been sent. Spoke with pharmacist and cancelled nasal spray. Sent Rx for  tablets, #10 x 5 refills.  Confirmed receipt with pharmacist.

## 2015-05-30 ENCOUNTER — Telehealth: Payer: Self-pay | Admitting: Internal Medicine

## 2015-05-30 MED ORDER — JANUMET XR 50-1000 MG PO TB24: 1.0000 | ORAL_TABLET | Freq: Two times a day (BID) | ORAL | Status: DC

## 2015-05-30 NOTE — Telephone Encounter (Signed)
Patient called stating that she would like a refill sent to her pharmacy  Rx: Janumet   Pharmacy: [facility]    Thank You

## 2015-06-16 ENCOUNTER — Telehealth: Payer: Self-pay | Admitting: Internal Medicine

## 2015-06-16 MED ORDER — INSULIN GLARGINE 300 UNIT/ML ~~LOC~~ SOPN: 25.0000 [IU] | PEN_INJECTOR | Freq: Every day | SUBCUTANEOUS | Status: DC

## 2015-06-16 NOTE — Telephone Encounter (Signed)
Pt needs refill on toujeo please sent to [facility]

## 2015-06-16 NOTE — Telephone Encounter (Signed)
Rx refill of Toujeo sent to pt's pharmacy.

## 2015-06-28 ENCOUNTER — Other Ambulatory Visit: Payer: Self-pay | Admitting: Internal Medicine

## 2015-07-22 ENCOUNTER — Ambulatory Visit: Payer: Self-pay | Admitting: Family

## 2015-07-27 ENCOUNTER — Other Ambulatory Visit: Payer: Self-pay | Admitting: Internal Medicine

## 2015-07-28 ENCOUNTER — Encounter: Payer: Self-pay | Admitting: Internal Medicine

## 2015-07-28 ENCOUNTER — Ambulatory Visit (INDEPENDENT_AMBULATORY_CARE_PROVIDER_SITE_OTHER): Payer: BLUE CROSS/BLUE SHIELD | Admitting: Internal Medicine

## 2015-07-28 ENCOUNTER — Other Ambulatory Visit: Payer: Self-pay | Admitting: *Deleted

## 2015-07-28 ENCOUNTER — Other Ambulatory Visit (INDEPENDENT_AMBULATORY_CARE_PROVIDER_SITE_OTHER): Payer: BLUE CROSS/BLUE SHIELD | Admitting: *Deleted

## 2015-07-28 VITALS — BP 120/80 | HR 91 | Temp 98.1°F | Resp 12 | Wt 164.0 lb

## 2015-07-28 DIAGNOSIS — E1065 Type 1 diabetes mellitus with hyperglycemia: Secondary | ICD-10-CM | POA: Diagnosis not present

## 2015-07-28 LAB — POCT GLYCOSYLATED HEMOGLOBIN (HGB A1C): HEMOGLOBIN A1C: 7.7

## 2015-07-28 MED ORDER — INSULIN GLARGINE 300 UNIT/ML ~~LOC~~ SOPN: 20.0000 [IU] | PEN_INJECTOR | Freq: Every day | SUBCUTANEOUS | Status: DC

## 2015-07-28 MED ORDER — INSULIN ASPART 100 UNIT/ML FLEXPEN: 3.0000 [IU] | PEN_INJECTOR | Freq: Three times a day (TID) | SUBCUTANEOUS | Status: DC

## 2015-07-28 MED ORDER — JANUMET XR 50-1000 MG PO TB24: 1.0000 | ORAL_TABLET | Freq: Two times a day (BID) | ORAL | Status: DC

## 2015-07-28 NOTE — Patient Instructions (Signed)
Please decrease Toujeo to 20 units at bedtime. Please continue: - JanuMet XR 50-1000 mg 2x a day  - NovoLog 3x a day, 15 min before a meal: 1:20 insulin to carb ratio - Novolog Sliding scale:  - 150-175: + 1 unit  - 176-200: + 2 units  - 201-225: + 3 units  - 226-250: + 4 units  - 251-275: + 5 units - 276-300: + 6 units  Please return in 3 months with your sugar log.

## 2015-07-28 NOTE — Progress Notes (Signed)
Patient ID: Kathryn Rogers, female   DOB: 09/22/1960, 54 y.o.   MRN: [id number]  HPI: Kathryn Rogers is a 54 y.o.-year-old female, returning for management of DM1, dx in 2010, but dx'ed at DM1 in 04/2015, insulin-dependent since dx, uncontrolled, without complications. Last visit 3 mo ago.  Last hemoglobin A1c was: Lab Results  Component Value Date   HGBA1C 8.0 04/27/2015   HGBA1C 8.8* 01/25/2015   HGBA1C 9.1* 10/11/2014   At last visits, we checked labs for DM1: negative ABs but undetectable C-pp Component     Latest Ref Rng 12/15/2014 04/27/2015  C-Peptide     0.80 - 3.90 ng/mL <0.05 (L) <0.05 (L)  Glucose, Fasting     65 - 99 mg/dL 409118 (H) 75  Glutamic Acid Decarb Ab     <5 IU/mL <5   Pancreatic Islet Cell Antibody     < 5 JDF Units <5    Pt is on a regimen of: - JanuMet XR 50-1000 mg 2x a day - before b'fast and in the pm. She was off the medicine in the past however she noticed that her sugars are higher at that time.  - Toujeo 25 >> 27 >> 25 units in am - NovoLog 3x a day, 15 min before a meal - restarted 11/2014: 1:20 ICR - Novolog Sliding scale:  - 150-175: + 1 unit  - 176-200: + 2 units  - 201-225: + 3 units  - 226-250: + 4 units  - 251-275: + 5 units - 276-300: + 6 units We stopped Glipizide 11/2014 when started NovoLog. Was on Novolog before - 3x a day, before meals - 1 Units per carb choice (15 g carbs), ave 3 units per meal Takes alphalipoic acid. Used to take Cinammon.  Pt checks her sugars 2x a day and they are: - am: 60, 110-115, 288 >> 39x1, 50, 71-220, 276, 298 >> 73, 81-213 >> 56, 62-153, 226 >> 62-161, 229 (rebound from low CBGs at night0 - 2h after b'fast: n/c >> 373 (forget meds that am) >> 179-294 >> n/c - before lunch: n/c >> 167 >> 145-175, 320 >> 54, 269 >> 56, 91-130 - 2h after lunch: 135-343 (may take 2 units of insulin) >> 204-330 >>  79, 142-266 >> 43, 108, 167 >> 55, 63-79 - before dinner: 49 x1, 93-394 >> 176-233, 332 >> 48, 72-239 >> 48, 72-239  >> >> 52, 62-173, 241 >> 120, 130 - 2h after dinner: 107-273, 488x1 >> 129-279 >> 54, 74-285, 332 >> 72-182, 298 (sweet tea) - bedtime: 48 x1 >> n/c >> 48, 92-216 >> 46, 67-415 (sweet tea) - nighttime: n/c >> 193 >> 40-59 + lows, but less than before. Lowest sugar was 40s; she has hypoglycemia awareness at 80.  Highest sugar was in the 300s >> 415  Glucometer: One Touch mini  Pt's meals are: - Breakfast: cereal or carnation instant b'fast - Lunch: grilled salmon, salad + apple - Dinner: grille chicken, grilled grilled, baked potatoes - Snacks: 2 maybe  She walks for exercise - 20 min 2x a day - 2 mi a day; + swimming.   - no CKD, last BUN/creatinine:  Lab Results  Component Value Date   BUN 12 03/22/2015   CREATININE 0.64 03/22/2015   - last set of lipids: Lab Results  Component Value Date   CHOL 169 10/11/2014   HDL 65.60 10/11/2014   LDLCALC 78 10/11/2014   TRIG 128.0 10/11/2014   CHOLHDL 3 10/11/2014   - last eye  exam was in 12/2014. [facility]. No DR.  - no numbness and tingling in her feet.  ROS: Constitutional: no Weight loss, no  fatigue Eyes: no blurry vision, no xerophthalmia ENT: no sore throat, no nodules palpated in throat, no dysphagia/odynophagia, no hoarseness Cardiovascular: no CP/SOB/palpitations/leg swelling Respiratory: no cough/SOB Gastrointestinal: no N/V/D/C Musculoskeletal: no muscle/joint aches Skin: no rashes Neurological: no tremors/numbness/tingling/dizziness  I reviewed pt's medications, allergies, PMH, social hx, family hx, and changes were documented in the history of present illness. Otherwise, unchanged from my initial visit note: Past Medical History  Diagnosis Date  . Migraine   . Diabetes mellitus without complication   . Hypertension   . History of rheumatic fever     age 23  . Hyperlipidemia   . Diverticulosis    Past Surgical History  Procedure Laterality Date  . Cholecystectomy  1993  . Abdominal hysterectomy  1991  .  Lumbar disc surgery  2004    L5, discectomy and fusion  . Cesarean section      x 2   History   Social History  . Marital Status: Married    Spouse Name: N/A   Social History Main Topics  . Smoking status: Never Smoker   . Smokeless tobacco: Never Used  . Alcohol Use: No  . Drug Use: No   Social History Narrative   Moved from [place] 2016, originally from [facility] (parents/sisters in [place])   Homemaker   Completed 12th grade   2 daughter (6 grandchildren) one grandchild lives with her.   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   Current Outpatient Prescriptions on File Prior to Visit  Medication Sig Dispense Refill  . AFLURIA PRESERVATIVE FREE 0.5 ML SUSY   0  . amLODipine (NORVASC) 5 MG tablet Take 1 tablet (5 mg total) by mouth daily. 90 tablet 5  . aspirin EC 81 MG tablet Take 81 mg by mouth daily.    . ferrous sulfate 325 (65 FE) MG tablet Take 1 tablet (325 mg total) by mouth 2 (two) times daily with a meal.  3  . fluticasone (FLONASE) 50 MCG/ACT nasal spray As needed  0  . glucose blood test strip Use as instructed to check blood sugar 3 times daily. One Touch Ultra Blue Test 100 each 5  . insulin aspart (NOVOLOG FLEXPEN) 100 UNIT/ML FlexPen Inject 3-5 Units into the skin 3 (three) times daily with meals. 15 mL 1  . Insulin Glargine (TOUJEO SOLOSTAR) 300 UNIT/ML SOPN Inject 25 Units into the skin daily before breakfast. 3 pen 2  . Insulin Pen Needle 31G X 8 MM MISC Use to give insulin injections 3-4 times daily 400 each 3  . JANUMET XR 50-1000 MG TB24 Take 1 tablet by mouth 2 (two) times daily. 60 tablet 2  . Lancets (ONETOUCH ULTRASOFT) lancets 1 each by Other route as needed for other. Use as instructed to check blood sugar 4 times a day. 400 each 0  . Lancets (ONETOUCH ULTRASOFT) lancets USE ONE LANCET 4 TIMES DAILY TO  CHECK  BLOOD  SUGAR. 400 each 1  . ondansetron (ZOFRAN ODT) 4  MG disintegrating tablet Take 1 tablet (4 mg total) by mouth every 8 (eight) hours as needed for nausea or vomiting. 20 tablet 0  . SUMAtriptan (IMITREX) 100 MG tablet TAKE ONE TABLET BY MOUTH AS NEEDED 10 tablet 5  . TOUJEO  SOLOSTAR 300 UNIT/ML SOPN INJECT 24 UNITS SUBCUTANEOUSLY ONCE DAILY BEFORE BREAKFAST 6 pen 2   No current facility-administered medications on file prior to visit.   Allergies  Allergen Reactions  . Codeine Other (See Comments)    Dizziness, n/v, head itches   Family History  Problem Relation Age of Onset  . Hypertension Father   . Cancer Paternal Aunt     breast  . Alcohol abuse Paternal Grandfather   . Cancer Cousin     breast   PE: BP 120/80 mmHg  Pulse 91  Temp(Src) 98.1 F (36.7 C) (Oral)  Resp 12  Wt 164 lb (74.39 kg)  SpO2 95% Body mass index is 32.03 kg/(m^2). Wt Readings from Last 3 Encounters:  04/27/15 162 lb (73.483 kg)  04/08/15 160 lb 12.8 oz (72.938 kg)  04/01/15 154 lb 8 oz (70.081 kg)   Constitutional: overweight, in NAD Eyes: PERRLA, EOMI, no exophthalmos ENT: moist mucous membranes, no thyromegaly, no cervical lymphadenopathy Cardiovascular: RRR, No MRG Respiratory: CTA B Gastrointestinal: abdomen soft, NT, ND, BS+ Musculoskeletal: no deformities, strength intact in all 4 Skin: moist, warm, no rashes Neurological: no tremor with outstretched hands, DTR normal in all 4  ASSESSMENT: 1. DM, uncontrolled, without complications  PLAN:  1. Patient with long-standing, uncontrolled diabetes, on a regimen of basal insulin (Toujeo) + JanuMet + mealtime insulin. At last visit, we demonstrated that she actually has type I, rather than type 2 diabetes, as a C-peptide was undetectable. We continued JanuMet along with the insulin, as she tried to stop it in the past and is elevated her sugars. She is aware that she needs to stay very well-hydrated while on metformin. At today's visit, she c/o having lows at night >> will reduce Toujeo further.   - I suggested to:    Patient Instructions  Please decrease Toujeo to 20 units at bedtime. Please continue: - JanuMet XR 50-1000 mg 2x a day  - NovoLog 3x a day, 15 min before a meal: 1:20 insulin to carb ratio - Novolog Sliding scale:  - 150-175: + 1 unit  - 176-200: + 2 units  - 201-225: + 3 units  - 226-250: + 4 units  - 251-275: + 5 units - 276-300: + 6 units  Please return in 3 months with your sugar log.   - check Hba1c today >> 7.7% (better!) - UTD with eye exams, flu and PNA vaccines - given new log - continue checking sugars at different times of the day - check 3 times a day, rotating checks - Return to clinic in 3 mo with sugar log

## 2015-08-02 ENCOUNTER — Other Ambulatory Visit: Payer: Self-pay | Admitting: Family

## 2015-08-03 ENCOUNTER — Encounter: Payer: Self-pay | Admitting: Family

## 2015-08-03 MED ORDER — FLUTICASONE PROPIONATE 50 MCG/ACT NA SUSP: 1.0000 | Freq: Every day | NASAL | Status: DC

## 2015-09-14 ENCOUNTER — Ambulatory Visit (HOSPITAL_BASED_OUTPATIENT_CLINIC_OR_DEPARTMENT_OTHER)
Admission: RE | Admit: 2015-09-14 | Discharge: 2015-09-14 | Disposition: A | Discharge status: Home / Self Care | Payer: BLUE CROSS/BLUE SHIELD | Source: Ambulatory Visit | Attending: Family | Admitting: Family

## 2015-09-14 ENCOUNTER — Encounter: Payer: Self-pay | Admitting: Family

## 2015-09-14 ENCOUNTER — Ambulatory Visit (INDEPENDENT_AMBULATORY_CARE_PROVIDER_SITE_OTHER): Payer: BLUE CROSS/BLUE SHIELD | Admitting: Family

## 2015-09-14 VITALS — BP 117/82 | HR 96 | Temp 98.6°F | Resp 16 | Ht 60.0 in | Wt 167.0 lb

## 2015-09-14 DIAGNOSIS — Z8701 Personal history of pneumonia (recurrent): Secondary | ICD-10-CM | POA: Diagnosis not present

## 2015-09-14 DIAGNOSIS — J984 Other disorders of lung: Secondary | ICD-10-CM | POA: Diagnosis not present

## 2015-09-14 DIAGNOSIS — J189 Pneumonia, unspecified organism: Secondary | ICD-10-CM | POA: Insufficient documentation

## 2015-09-14 DIAGNOSIS — M25531 Pain in right wrist: Secondary | ICD-10-CM

## 2015-09-14 LAB — CBC WITH DIFFERENTIAL/PLATELET
BASOS ABS: 0 10*3/uL (ref 0.0–0.1)
Basophils Relative: 0.4 % (ref 0.0–3.0)
Eosinophils Absolute: 0.3 10*3/uL (ref 0.0–0.7)
Eosinophils Relative: 2.8 % (ref 0.0–5.0)
HCT: 35.7 % — ABNORMAL LOW (ref 36.0–46.0)
Hemoglobin: 11 g/dL — ABNORMAL LOW (ref 12.0–15.0)
LYMPHS ABS: 2.9 10*3/uL (ref 0.7–4.0)
Lymphocytes Relative: 30.7 % (ref 12.0–46.0)
MCHC: 30.8 g/dL (ref 30.0–36.0)
MCV: 72.1 fl — ABNORMAL LOW (ref 78.0–100.0)
MONO ABS: 0.5 10*3/uL (ref 0.1–1.0)
MONOS PCT: 5.4 % (ref 3.0–12.0)
NEUTROS PCT: 60.7 % (ref 43.0–77.0)
Neutro Abs: 5.8 10*3/uL (ref 1.4–7.7)
Platelets: 431 10*3/uL — ABNORMAL HIGH (ref 150.0–400.0)
RBC: 4.96 Mil/uL (ref 3.87–5.11)
RDW: 17.5 % — ABNORMAL HIGH (ref 11.5–15.5)
WBC: 9.6 10*3/uL (ref 4.0–10.5)

## 2015-09-14 LAB — URIC ACID: URIC ACID, SERUM: 5.5 mg/dL (ref 2.4–7.0)

## 2015-09-14 MED ORDER — MELOXICAM 7.5 MG PO TABS: 7.5000 mg | ORAL_TABLET | Freq: Every day | ORAL | Status: DC

## 2015-09-14 NOTE — Patient Instructions (Addendum)
Please complete lab work prior to leaving.  Please complete chest x ray on the first floor.  Start meloxicam (anti-inflammatory) for right wrist pain. Call if wrist pain worsens, if you develop fever, or if symptoms are not improved in 2 weeks.

## 2015-09-14 NOTE — Progress Notes (Signed)
Subjective:    Patient ID: Kathryn Rogers, female    DOB: [birth year], 55 y.o.   MRN: [id number]  HPI  Kathryn Rogers is a 55 yr old female who presents today with chief complaint of right wrist pain.  She is left hand dominant. Pain began around Thanksgiving.  She reports that the wrist feels warm to the touch.  Using a wrist brace at night which helps some. She has tried occasional ibuprofen.  Denies known injury.  Denies any other joint pain or fever. Paint radiates into the right thumb.    I last saw her following PNA back in August. CXR showed partial clearing of PNA. She was advised to follow up in 3 weeks for follow up CXR to ensure resolution. She did not complete this follow up chest x ray.   Review of Systems See HPI  Past Medical History  Diagnosis Date  . Migraine   . Diabetes mellitus without complication (HCC)   . Hypertension   . History of rheumatic fever     age 67  . Hyperlipidemia   . Diverticulosis     Social History   Social History  . Marital Status: Married    Spouse Name: N/A  . Number of Children: N/A  . Years of Education: N/A   Occupational History  . Not on file.   Social History Main Topics  . Smoking status: Never Smoker   . Smokeless tobacco: Never Used  . Alcohol Use: No  . Drug Use: No  . Sexual Activity: Not on file   Other Topics Concern  . Not on file   Social History Narrative   Moved from [place] 2016, originally from [facility] (parents/sisters in [place])   Homemaker   Completed 12th grade   2 daughter (6 grandchildren) one grandchild lives with her.   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    Past Surgical History  Procedure Laterality Date  . Cholecystectomy  1993  . Abdominal hysterectomy  1991  . Lumbar disc surgery  2004    L5, discectomy and fusion  . Cesarean section      x 2    Family History  Problem Relation Age of Onset  .  Hypertension Father   . Cancer Paternal Aunt     breast  . Alcohol abuse Paternal Grandfather   . Cancer Cousin     breast    Allergies  Allergen Reactions  . Codeine Other (See Comments)    Dizziness, n/v, head itches    Current Outpatient Prescriptions on File Prior to Visit  Medication Sig Dispense Refill  . amLODipine (NORVASC) 5 MG tablet Take 1 tablet (5 mg total) by mouth daily. 90 tablet 5  . aspirin EC 81 MG tablet Take 81 mg by mouth daily.    . ferrous sulfate 325 (65 FE) MG tablet Take 1 tablet (325 mg total) by mouth 2 (two) times daily with a meal.  3  . fluticasone (FLONASE) 50 MCG/ACT nasal spray Place 1 spray into both nostrils daily. As needed 16 g 5  . glucose blood test strip Use as instructed to check blood sugar 3 times daily. One Touch Ultra Blue Test 100 each 5  . insulin aspart (NOVOLOG FLEXPEN) 100 UNIT/ML FlexPen Inject 3-5 Units into the skin 3 (three) times daily with meals. (Patient taking differently: Inject 12-16 Units into the  skin daily. Per carbohydrate ratio) 15 mL 1  . Insulin Glargine (TOUJEO SOLOSTAR) 300 UNIT/ML SOPN Inject 20 Units into the skin at bedtime. (Patient taking differently: Inject 24 Units into the skin at bedtime. ) 6 pen 5  . Insulin Pen Needle 31G X 8 MM MISC Use to give insulin injections 3-4 times daily 400 each 3  . JANUMET XR 50-1000 MG TB24 Take 1 tablet by mouth 2 (two) times daily. 180 tablet 1  . Lancets (ONETOUCH ULTRASOFT) lancets USE ONE LANCET 4 TIMES DAILY TO  CHECK  BLOOD  SUGAR. 400 each 1  . ondansetron (ZOFRAN ODT) 4 MG disintegrating tablet Take 1 tablet (4 mg total) by mouth every 8 (eight) hours as needed for nausea or vomiting. 20 tablet 0  . SUMAtriptan (IMITREX) 100 MG tablet TAKE ONE TABLET BY MOUTH AS NEEDED 10 tablet 5   No current facility-administered medications on file prior to visit.    Pulse 96  Temp(Src) 98.6 F (37 C) (Oral)  Resp 16  Ht 5' (1.524 m)  Wt 167 lb (75.751 kg)  BMI 32.62 kg/m2   SpO2 100%       Objective:   Physical Exam  Constitutional: She is oriented to person, place, and time. She appears well-developed and well-nourished.  HENT:  Head: Normocephalic and atraumatic.  Cardiovascular: Normal rate, regular rhythm and normal heart sounds.   No murmur heard. Pulmonary/Chest: Effort normal and breath sounds normal. No respiratory distress. She has no wheezes.  Musculoskeletal:  Mild swelling right dorsal wrist, no warmth or erythema noted.  + flexion/extension.    Neurological: She is alert and oriented to person, place, and time.  Psychiatric: She has a normal mood and affect. Her behavior is normal. Judgment and thought content normal.          Assessment & Plan:  Patient requests zostavax today- reports that she checked with her insurance and they will cover zostavax . (we are out of zostavax and expect it to be in stock by the end of the week. A vaccine visit will be scheduled)  R wrist pain- obtain uric acid level to rule out gout. Will also check cbc to assess for infection. Trial of meloxicam. If w/u unrevealing and symptoms do not improve, plan testing for autoimmune cause and referring to ortho for further eval.   Hx of pneumonia- I have advised patient to follow through with the follow up CXR to ensure resolution.

## 2015-09-14 NOTE — Progress Notes (Signed)
Pre visit review using our clinic review tool, if applicable. No additional management support is needed unless otherwise documented below in the visit note. 

## 2015-09-15 ENCOUNTER — Encounter: Payer: Self-pay | Admitting: Family

## 2015-09-15 DIAGNOSIS — Z1211 Encounter for screening for malignant neoplasm of colon: Secondary | ICD-10-CM

## 2015-09-27 ENCOUNTER — Telehealth: Payer: Self-pay | Admitting: Internal Medicine

## 2015-09-27 NOTE — Telephone Encounter (Signed)
Dr. Rhea Belton reviewed records and has accepted patient. Next colon not due until 2023. Informed patient of this. She states that she was told that she has anemia problems. I offered to schedule an appointment for her but she states that she will call her pcp regarding this. Recall Colon tab entered.

## 2015-10-03 ENCOUNTER — Encounter: Payer: Self-pay | Admitting: Internal Medicine

## 2015-10-13 ENCOUNTER — Other Ambulatory Visit: Payer: Self-pay | Admitting: Family

## 2015-10-26 ENCOUNTER — Other Ambulatory Visit (INDEPENDENT_AMBULATORY_CARE_PROVIDER_SITE_OTHER): Payer: BLUE CROSS/BLUE SHIELD | Admitting: *Deleted

## 2015-10-26 ENCOUNTER — Encounter: Payer: Self-pay | Admitting: Internal Medicine

## 2015-10-26 ENCOUNTER — Ambulatory Visit (INDEPENDENT_AMBULATORY_CARE_PROVIDER_SITE_OTHER): Payer: BLUE CROSS/BLUE SHIELD | Admitting: Internal Medicine

## 2015-10-26 DIAGNOSIS — E1065 Type 1 diabetes mellitus with hyperglycemia: Secondary | ICD-10-CM | POA: Diagnosis not present

## 2015-10-26 LAB — POCT GLYCOSYLATED HEMOGLOBIN (HGB A1C): Hemoglobin A1C: 7.4

## 2015-10-26 MED ORDER — INSULIN GLARGINE 300 UNIT/ML ~~LOC~~ SOPN: 16.0000 [IU] | PEN_INJECTOR | Freq: Every day | SUBCUTANEOUS | Status: DC

## 2015-10-26 MED ORDER — INSULIN ASPART 100 UNIT/ML FLEXPEN: 4.0000 [IU] | PEN_INJECTOR | Freq: Three times a day (TID) | SUBCUTANEOUS | Status: DC

## 2015-10-26 NOTE — Progress Notes (Signed)
Patient ID: Kathryn Rogers, female   DOB: 09/21/1960, 55 y.o.   MRN: [id number]  HPI: Kathryn Beeamela Braley is a 55 y.o.-year-old female, returning for management of DM1, dx in 2010, but dx'ed at DM1 in 04/2015, insulin-dependent since dx, uncontrolled, without complications. Last visit 3 mo ago.  She went to [place] for 1 week in 09/2015 >> low CBGs as she was walking more >> 37. She decreased the Toujeo. dose to 18 units then.  Last hemoglobin A1c was: Lab Results  Component Value Date   HGBA1C 7.7 07/28/2015   HGBA1C 8.0 04/27/2015   HGBA1C 8.8* 01/25/2015   At last visits, we checked labs for DM1: negative ABs but undetectable C-pp Component     Latest Ref Rng 12/15/2014 04/27/2015  C-Peptide     0.80 - 3.90 ng/mL <0.05 (L) <0.05 (L)  Glucose, Fasting     65 - 99 mg/dL 086118 (H) 75  Glutamic Acid Decarb Ab     <5 IU/mL <5   Pancreatic Islet Cell Antibody     < 5 JDF Units <5    Pt is on a regimen of: - JanuMet XR 50-1000 mg 2x a day - before b'fast and in the pm. She was off the medicine in the past however she noticed that her sugars are higher at that time.  - Toujeo 25 >> 20 units in am - NovoLog 3x a day, 15 min before a meal - restarted 11/2014: 1:20 ICR >> 4-6 units per meal - Novolog Sliding scale:  - 150-175: + 1 unit  - 176-200: + 2 units  - 201-225: + 3 units  - 226-250: + 4 units  - 251-275: + 5 units - 276-300: + 6 units We stopped Glipizide 11/2014 when started NovoLog. Was on Novolog before - 3x a day, before meals - 1 Units per carb choice (15 g carbs), ave 3 units per meal Takes alphalipoic acid. Used to take Cinammon.  Pt checks her sugars 2x a day and they are: - am: 60, 110-115, 288 >> 39x1, 50, 71-220, 276, 298 >> 73, 81-213 >> 56, 62-153, 226 >> 62-161, 229 >> 44, 58, 63, 69-167, 285 - 2h after b'fast: n/c >> 373 (forget meds that am) >> 179-294 >> n/c>> 88, 225 - before lunch: n/c >> 167 >> 145-175, 320 >> 54, 269 >> 56, 91-130 >> 61-66 - 2h after lunch: 135-343  (may take 2 units of insulin) >> 204-330 >>  79, 142-266 >> 43, 108, 167 >> 55, 63-79 >> 71, 345 - before dinner: 49 x1, 93-394 >> 176-233, 332 >> 48, 72-239 >> 48, 72-239 >> >> 52, 62-173, 241 >> 120, 130 >> 53, 91-93 - 2h after dinner: 107-273, 488x1 >> 129-279 >> 54, 74-285, 332 >> 72-182, 298 (sweet tea) >> 72-290, 394 - bedtime: 48 x1 >> n/c >> 48, 92-216 >> 46, 67-415 (sweet tea) >> 50-166 - nighttime: n/c >> 193 >> 40-59 >> 37 (in [place]) + lows, but less than before. Lowest sugar was 40s; she has hypoglycemia awareness at 80.  Highest sugar was in the 300s >> 415 >> 394  Glucometer: One Touch mini  Pt's meals are: - Breakfast: cereal or carnation instant b'fast - Lunch: grilled salmon, salad + apple - Dinner: grille chicken, grilled grilled, baked potatoes - Snacks: 2 maybe  She walks for exercise - 20 min 2x a day - 2 mi a day; + swimming.   - no CKD, last BUN/creatinine:  Lab Results  Component Value Date  BUN 12 03/22/2015   CREATININE 0.64 03/22/2015   - last set of lipids: Lab Results  Component Value Date   CHOL 169 10/11/2014   HDL 65.60 10/11/2014   LDLCALC 78 10/11/2014   TRIG 128.0 10/11/2014   CHOLHDL 3 10/11/2014   - last eye exam was in 12/2014. [facility]. No DR.  - no numbness and tingling in her feet.  She is going to [employer] in 11/2015.  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  ROS: Constitutional: no Weight loss, no  fatigue Eyes: no blurry vision, no xerophthalmia ENT: no sore throat, no nodules palpated in throat, no dysphagia/odynophagia, no hoarseness Cardiovascular: no CP/SOB/palpitations/leg swelling Respiratory: no cough/SOB Gastrointestinal: no N/V/D/C Musculoskeletal: no muscle/joint aches Skin: no rashes Neurological: no tremors/numbness/tingling/dizziness  I reviewed pt's medications, allergies, PMH, social hx, family hx, and changes were documented in the history of present illness. Otherwise, unchanged from my initial visit  note: Past Medical History  Diagnosis Date  . Migraine   . Diabetes mellitus without complication (HCC)   . Hypertension   . History of rheumatic fever     age 35  . Hyperlipidemia   . Diverticulosis    Past Surgical History  Procedure Laterality Date  . Cholecystectomy  1993  . Abdominal hysterectomy  1991  . Lumbar disc surgery  2004    L5, discectomy and fusion  . Cesarean section      x 2   History   Social History  . Marital Status: Married    Spouse Name: N/A   Social History Main Topics  . Smoking status: Never Smoker   . Smokeless tobacco: Never Used  . Alcohol Use: No  . Drug Use: No   Social History Narrative   Moved from [place] 2016, originally from [facility] (parents/sisters in [place])   Homemaker   Completed 12th grade   2 daughter (6 grandchildren) one grandchild lives with her.   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   Current Outpatient Prescriptions on File Prior to Visit  Medication Sig Dispense Refill  . amLODipine (NORVASC) 5 MG tablet Take 1 tablet (5 mg total) by mouth daily. 90 tablet 5  . amoxicillin (AMOXIL) 500 MG capsule Take as directed for dental appt.  5  . aspirin EC 81 MG tablet Take 81 mg by mouth daily.    . ferrous sulfate 325 (65 FE) MG tablet Take 1 tablet (325 mg total) by mouth 2 (two) times daily with a meal.  3  . fluticasone (FLONASE) 50 MCG/ACT nasal spray Place 1 spray into both nostrils daily. As needed 16 g 5  . glucose blood test strip Use as instructed to check blood sugar 3 times daily. One Touch Ultra Blue Test 100 each 5  . insulin aspart (NOVOLOG FLEXPEN) 100 UNIT/ML FlexPen Inject 3-5 Units into the skin 3 (three) times daily with meals. (Patient taking differently: Inject 12-16 Units into the skin daily. Per carbohydrate ratio) 15 mL 1  . Insulin Glargine (TOUJEO SOLOSTAR) 300 UNIT/ML SOPN Inject 20 Units into the skin at  bedtime. (Patient taking differently: Inject 24 Units into the skin at bedtime. ) 6 pen 5  . Insulin Pen Needle 31G X 8 MM MISC Use to give insulin injections 3-4 times daily 400 each 3  . JANUMET XR 50-1000 MG TB24 Take 1 tablet by mouth 2 (two) times daily. 180  tablet 1  . Lancets (ONETOUCH ULTRASOFT) lancets USE ONE LANCET TO CHECK GLUCOSE 4 TIMES DAILY 400 each 0  . meloxicam (MOBIC) 7.5 MG tablet Take 1 tablet (7.5 mg total) by mouth daily. 14 tablet 0  . ondansetron (ZOFRAN ODT) 4 MG disintegrating tablet Take 1 tablet (4 mg total) by mouth every 8 (eight) hours as needed for nausea or vomiting. 20 tablet 0  . SUMAtriptan (IMITREX) 100 MG tablet TAKE ONE TABLET BY MOUTH AS NEEDED 10 tablet 5   No current facility-administered medications on file prior to visit.   Allergies  Allergen Reactions  . Codeine Other (See Comments)    Dizziness, n/v, head itches   Family History  Problem Relation Age of Onset  . Hypertension Father   . Cancer Paternal Aunt     breast  . Alcohol abuse Paternal Grandfather   . Cancer Cousin     breast   PE: BP 112/68 mmHg  Pulse 97  Temp(Src) 97.7 F (36.5 C) (Oral)  Resp 12  Wt 169 lb (76.658 kg)  SpO2 99% Body mass index is 33.01 kg/(m^2). Wt Readings from Last 3 Encounters:  10/26/15 169 lb (76.658 kg)  09/14/15 167 lb (75.751 kg)  07/28/15 164 lb (74.39 kg)   Constitutional: overweight, in NAD Eyes: PERRLA, EOMI, no exophthalmos ENT: moist mucous membranes, no thyromegaly, no cervical lymphadenopathy Cardiovascular: RRR, No MRG Respiratory: CTA B Gastrointestinal: abdomen soft, NT, ND, BS+ Musculoskeletal: no deformities, strength intact in all 4 Skin: moist, warm, no rashes Neurological: no tremor with outstretched hands, DTR normal in all 4  ASSESSMENT: 1. DM1, uncontrolled, without complications  PLAN:  1. Patient with long-standing, uncontrolled diabetes, on a regimen of basal insulin (Toujeo) + JanuMet + mealtime insulin. At  last visits, we demonstrated that she actually has type I, rather than type 2 diabetes, as a C-peptide was undetectable. We continued JanuMet along with the insulin, as she tried to stop it in the past and is elevated her sugars. She is aware that she needs to stay very well-hydrated while on metformin. At last visit, she c/o having lows at night >> decreased Toujeo dose. At this visit, she continues to have lows, especially with walking more while in [place]. She will go to [employer] in 1 mo >> will decrease both Toujeo and Novolog for then. I also advised her to use more insulin with [place] food as her sugars are 200-300s after this. - I suggested to:  Patient Instructions  Please decrease:  - Toujeo to 16 units at bedtime.  Please continue: - JanuMet XR 50-1000 mg 2x a day  - NovoLog 3x a day, 15 min before a meal: 1:20 insulin to carb ratio - use up to 8 units with [place] food - Novolog Sliding scale:  - 150-175: + 1 unit  - 176-200: + 2 units  - 201-225: + 3 units  - 226-250: + 4 units  - 251-275: + 5 units - 276-300: + 6 units  If you plan to be more active, decrease Toujeo to 12 units and keep the Novolog at 4 units.  Please return in 3 months with your sugar log.   - check Hba1c today >> 7.4% (better!) - UTD with eye exams, flu and PNA vaccines - given new CBG log - continue checking sugars at different times of the day - check 3 times a day, rotating checks - Return to clinic in 3 mo with sugar log

## 2015-10-26 NOTE — Patient Instructions (Addendum)
Please decrease:  - Toujeo to 16 units at bedtime.  Please continue: - JanuMet XR 50-1000 mg 2x a day  - NovoLog 3x a day, 15 min before a meal: 1:20 insulin to carb ratio - use up to 8 units with [place] food - Novolog Sliding scale:  - 150-175: + 1 unit  - 176-200: + 2 units  - 201-225: + 3 units  - 226-250: + 4 units  - 251-275: + 5 units - 276-300: + 6 units  If you plan to be more active, decrease Toujeo to 12 units and keep the Novolog at 4 units.  Please return in 3 months with your sugar log.

## 2015-11-01 ENCOUNTER — Telehealth: Payer: Self-pay | Admitting: Internal Medicine

## 2015-11-01 MED ORDER — ONETOUCH ULTRASOFT LANCETS MISC: Status: DC

## 2015-11-01 MED ORDER — GLUCOSE BLOOD VI STRP: ORAL_STRIP | Status: DC

## 2015-11-01 NOTE — Telephone Encounter (Signed)
Pt needs refills on lancets and test strips for her one touch ultra meter please call [facility]

## 2015-11-01 NOTE — Telephone Encounter (Signed)
Refill sent to pharmacy.   

## 2015-11-07 ENCOUNTER — Telehealth: Payer: Self-pay | Admitting: *Deleted

## 2015-11-07 MED ORDER — SUMATRIPTAN SUCCINATE 100 MG PO TABS: 100.0000 mg | ORAL_TABLET | ORAL | Status: DC | PRN

## 2015-11-07 NOTE — Telephone Encounter (Signed)
Pt called requesting refill of imitrex to [facility] on [address]. Refill sent, pt made aware.

## 2015-11-11 ENCOUNTER — Encounter: Payer: Self-pay | Admitting: Behavioral Health

## 2015-11-11 ENCOUNTER — Telehealth: Payer: Self-pay | Admitting: Behavioral Health

## 2015-11-11 NOTE — Telephone Encounter (Signed)
Pre-Visit Call completed with patient and chart updated.   Pre-Visit Info documented in Specialty Comments under SnapShot.    

## 2015-11-14 ENCOUNTER — Encounter: Payer: Self-pay | Admitting: Family

## 2015-11-14 ENCOUNTER — Ambulatory Visit (INDEPENDENT_AMBULATORY_CARE_PROVIDER_SITE_OTHER): Payer: BLUE CROSS/BLUE SHIELD | Admitting: Family

## 2015-11-14 VITALS — BP 122/68 | HR 85 | Temp 97.8°F | Resp 18 | Ht 60.0 in | Wt 168.4 lb

## 2015-11-14 DIAGNOSIS — Z23 Encounter for immunization: Secondary | ICD-10-CM

## 2015-11-14 DIAGNOSIS — Z1159 Encounter for screening for other viral diseases: Secondary | ICD-10-CM

## 2015-11-14 DIAGNOSIS — Z0000 Encounter for general adult medical examination without abnormal findings: Secondary | ICD-10-CM | POA: Diagnosis not present

## 2015-11-14 DIAGNOSIS — Z1239 Encounter for other screening for malignant neoplasm of breast: Secondary | ICD-10-CM | POA: Diagnosis not present

## 2015-11-14 LAB — HEPATITIS C ANTIBODY: HCV Ab: NEGATIVE

## 2015-11-14 MED ORDER — ZOSTER VACCINE LIVE 19400 UNT/0.65ML ~~LOC~~ SOLR: 0.6500 mL | Freq: Once | SUBCUTANEOUS | Status: DC

## 2015-11-14 NOTE — Progress Notes (Signed)
Pre visit review using our clinic review tool, if applicable. No additional management support is needed unless otherwise documented below in the visit note. 

## 2015-11-14 NOTE — Addendum Note (Signed)
Addended by: Sandford Craze'SULLIVAN, Rei Medlen on: 11/14/2015 04:22 PM   Modules accepted: Kipp BroodSmartSet

## 2015-11-14 NOTE — Progress Notes (Addendum)
Subjective:    Patient ID: Kathryn Rogers, female    DOB: 10-25-1960, 55 y.o.   MRN: [id number]  HPI   Ms.  Herrle is a 55 yr old female who presents today for cpx.  Patient has been exercising regularly and has improved diet. Denies CP/palp/SOB/congestion/fevers/GI or GU c/o. Taking meds as prescribed.   Immunizations: up to date Diet: healthy Exercise: regular Colonoscopy:2013 Dexa: 13 yrs ago Pap Smear:hysterectomy Mammogram: 6/16      Review of Systems  Constitutional: Negative for unexpected weight change.  HENT: Negative for rhinorrhea.   Respiratory: Negative for cough.   Gastrointestinal: Positive for diarrhea. Negative for constipation.       She attributes diarrhea to janumet  Genitourinary: Negative for dysuria and frequency.  Musculoskeletal: Negative for myalgias and arthralgias.  Skin: Negative for rash.   Past Medical History  Diagnosis Date  . Migraine   . Diabetes mellitus without complication (HCC)   . Hypertension   . History of rheumatic fever     age 64  . Hyperlipidemia   . Diverticulosis     Social History   Social History  . Marital Status: Married    Spouse Name: N/A  . Number of Children: N/A  . Years of Education: N/A   Occupational History  . Not on file.   Social History Main Topics  . Smoking status: Never Smoker   . Smokeless tobacco: Never Used  . Alcohol Use: No  . Drug Use: No  . Sexual Activity: Not on file   Other Topics Concern  . Not on file   Social History Narrative   Moved from [place] 2016, originally from [facility] [place] [facility] (parents/sisters in [place])   Homemaker   Completed 12th grade   2 daughter (6 grandchildren) one grandchild lives with her.   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    Past Surgical History  Procedure Laterality Date  . Cholecystectomy  1993  . Abdominal hysterectomy  1991  . Lumbar disc surgery  2004      L5, discectomy and fusion  . Cesarean section      x 2    Family History  Problem Relation Age of Onset  . Hypertension Father   . Cancer Paternal Aunt     breast  . Alcohol abuse Paternal Grandfather   . Cancer Cousin     breast    Allergies  Allergen Reactions  . Codeine Other (See Comments)    Dizziness, n/v, head itches    Current Outpatient Prescriptions on File Prior to Visit  Medication Sig Dispense Refill  . amLODipine (NORVASC) 5 MG tablet Take 1 tablet (5 mg total) by mouth daily. 90 tablet 5  . amoxicillin (AMOXIL) 500 MG capsule Reported on 11/11/2015  5  . aspirin EC 81 MG tablet Take 81 mg by mouth daily.    . ferrous sulfate 325 (65 FE) MG tablet Take 1 tablet (325 mg total) by mouth 2 (two) times daily with a meal.  3  . fluticasone (FLONASE) 50 MCG/ACT nasal spray Place 1 spray into both nostrils daily. As needed 16 g 5  . glucose blood test strip Use as instructed to check blood sugar 4 times daily. (One Touch Ultra Blue Test) 135 each 5  . insulin aspart (NOVOLOG FLEXPEN) 100 UNIT/ML FlexPen Inject 4-8 Units into the skin 3 (three) times daily with  meals. (Patient taking differently: Inject 6 Units into the skin 3 (three) times daily with meals. ) 15 mL 2  . Insulin Glargine (TOUJEO SOLOSTAR) 300 UNIT/ML SOPN Inject 16 Units into the skin at bedtime. 6 pen 2  . Insulin Pen Needle 31G X 8 MM MISC Use to give insulin injections 3-4 times daily 400 each 3  . JANUMET XR 50-1000 MG TB24 Take 1 tablet by mouth 2 (two) times daily. 180 tablet 1  . Lancets (ONETOUCH ULTRASOFT) lancets USE ONE LANCET TO CHECK GLUCOSE 4 TIMES DAILY 400 each 1  . ondansetron (ZOFRAN ODT) 4 MG disintegrating tablet Take 1 tablet (4 mg total) by mouth every 8 (eight) hours as needed for nausea or vomiting. 20 tablet 0  . SUMAtriptan (IMITREX) 100 MG tablet Take 1 tablet (100 mg total) by mouth as needed. May repeat in 2 hours if headache persists or recurs. 10 tablet 5   No current  facility-administered medications on file prior to visit.    BP 122/68 mmHg  Pulse 85  Temp(Src) 97.8 F (36.6 C) (Oral)  Resp 18  Ht 5' (1.524 m)  Wt 168 lb 6.4 oz (76.386 kg)  BMI 32.89 kg/m2  SpO2 100%       Objective:   Physical Exam  Constitutional: She is oriented to person, place, and time. She appears well-developed and well-nourished. No distress.  HENT:  Head: Normocephalic and atraumatic.  Eyes: Conjunctivae are normal.  Neck: Neck supple. No thyromegaly present.  Cardiovascular: Normal rate, regular rhythm and normal heart sounds.   No murmur heard. Pulmonary/Chest: Effort normal and breath sounds normal. No respiratory distress.  Abdominal: Soft. Bowel sounds are normal. She exhibits no distension and no mass. There is no tenderness.  Musculoskeletal: She exhibits no edema.  Lymphadenopathy:    She has no cervical adenopathy.  Neurological: She is alert and oriented to person, place, and time.  Skin: Skin is warm and dry.  Psychiatric: She has a normal mood and affect. Her behavior is normal.          Assessment & Plan:  EKG tracing is personally reviewed.  EKG notes NSR.  No acute changes.

## 2015-11-14 NOTE — Addendum Note (Signed)
Addended by: Mervin KungFERGERSON, Jordin Vicencio A on: 11/14/2015 03:42 PM   Modules accepted: Orders

## 2015-11-14 NOTE — Addendum Note (Signed)
Addended by: Sandford Craze'SULLIVAN, Allex Lapoint on: 11/14/2015 06:47 PM   Modules accepted: Kipp BroodSmartSet

## 2015-11-14 NOTE — Assessment & Plan Note (Signed)
Discussed healthy diet, exercise. Obtain routine labs, refer for mammo in June. Request zostavax today.

## 2015-11-14 NOTE — Patient Instructions (Addendum)
Schedule Mammogram after February 11, 2016. Go to lab and do blood work.

## 2015-11-15 ENCOUNTER — Encounter: Payer: Self-pay | Admitting: Family

## 2015-11-15 LAB — LIPID PANEL
CHOL/HDL RATIO: 3
Cholesterol: 150 mg/dL (ref 0–200)
HDL: 46.8 mg/dL (ref >39.00)
LDL Cholesterol: 75 mg/dL (ref 0–99)
NonHDL: 103.63
TRIGLYCERIDES: 143 mg/dL (ref 0.0–149.0)
VLDL: 28.6 mg/dL (ref 0.0–40.0)

## 2015-11-15 LAB — HEPATIC FUNCTION PANEL
ALT: 22 U/L (ref 0–35)
AST: 20 U/L (ref 0–37)
Albumin: 4 g/dL (ref 3.5–5.2)
Alkaline Phosphatase: 67 U/L (ref 39–117)
BILIRUBIN DIRECT: 0 mg/dL (ref 0.0–0.3)
TOTAL PROTEIN: 7.1 g/dL (ref 6.0–8.3)
Total Bilirubin: 0.2 mg/dL (ref 0.2–1.2)

## 2015-11-15 LAB — CBC
HCT: 34 % — ABNORMAL LOW (ref 36.0–46.0)
Hemoglobin: 10.6 g/dL — ABNORMAL LOW (ref 12.0–15.0)
MCHC: 31.2 g/dL (ref 30.0–36.0)
MCV: 71.2 fl — AB (ref 78.0–100.0)
PLATELETS: 401 10*3/uL — AB (ref 150.0–400.0)
RBC: 4.78 Mil/uL (ref 3.87–5.11)
RDW: 16.7 % — AB (ref 11.5–15.5)
WBC: 9 10*3/uL (ref 4.0–10.5)

## 2015-11-15 LAB — MICROALBUMIN / CREATININE URINE RATIO
CREATININE, U: 289.6 mg/dL
MICROALB/CREAT RATIO: 1 mg/g (ref 0.0–30.0)
Microalb, Ur: 3 mg/dL — ABNORMAL HIGH (ref 0.0–1.9)

## 2015-11-15 LAB — TSH: TSH: 1.62 u[IU]/mL (ref 0.35–4.50)

## 2015-11-15 LAB — BASIC METABOLIC PANEL
BUN: 10 mg/dL (ref 6–23)
CALCIUM: 9.3 mg/dL (ref 8.4–10.5)
CO2: 27 mEq/L (ref 19–32)
CREATININE: 0.69 mg/dL (ref 0.40–1.20)
Chloride: 104 mEq/L (ref 96–112)
GFR: 94.27 mL/min (ref >60.00)
GLUCOSE: 62 mg/dL — AB (ref 70–99)
POTASSIUM: 3.7 meq/L (ref 3.5–5.1)
Sodium: 141 mEq/L (ref 135–145)

## 2015-11-17 ENCOUNTER — Telehealth: Payer: Self-pay | Admitting: Family

## 2015-11-17 ENCOUNTER — Encounter: Payer: Self-pay | Admitting: Family

## 2015-11-17 DIAGNOSIS — D509 Iron deficiency anemia, unspecified: Secondary | ICD-10-CM

## 2015-11-17 NOTE — Telephone Encounter (Signed)
She remains anemic. Is she taking iron supplement?   I would like her to complete IFOB. Also, can we add on serum iron and ferritin please? Dx anemia.

## 2015-11-17 NOTE — Telephone Encounter (Signed)
Spoke with pt and she will pick up the stool cards tomorrow afternoon. Pt states that she voices understanding on lab results. The serum iron and ferratin will need to be recollected since it is past the window to add on.

## 2015-11-20 NOTE — Telephone Encounter (Signed)
Could we please ask pt to complete serum iron and ferritin at her convenience?

## 2015-11-21 NOTE — Telephone Encounter (Signed)
Attempted to reach pt and left message that I am sending her a mychart message re: below recommendation. Future lab orders pended.

## 2015-11-23 ENCOUNTER — Other Ambulatory Visit (INDEPENDENT_AMBULATORY_CARE_PROVIDER_SITE_OTHER): Payer: BLUE CROSS/BLUE SHIELD

## 2015-11-23 DIAGNOSIS — D509 Iron deficiency anemia, unspecified: Secondary | ICD-10-CM

## 2015-11-23 LAB — IRON: IRON: 26 ug/dL — AB (ref 42–145)

## 2015-11-23 LAB — FERRITIN: FERRITIN: 6.9 ng/mL — AB (ref 10.0–291.0)

## 2015-11-24 ENCOUNTER — Telehealth: Payer: Self-pay | Admitting: Family

## 2015-11-24 DIAGNOSIS — D509 Iron deficiency anemia, unspecified: Secondary | ICD-10-CM

## 2015-11-24 NOTE — Telephone Encounter (Signed)
Please let pt know that iron level is low.  Is she taking iron supplement BID if so, needs to increase to TID? I also need her to complete an IFOB please.  Repeat CBC, iron, ferritin, 3 mos please.

## 2015-11-25 NOTE — Telephone Encounter (Signed)
Patient informed of results/instructions. She has been taking iron only once a day, but will try and increase to twice daily.  She did stated she has had some nausea problems with the iron, but will certainly do twice a day. Scheduled lab appointment in July and put order in. Patient has her IFOB and will complete asap.

## 2015-11-25 NOTE — Telephone Encounter (Signed)
Patient did agree to start taking the iron twice a day, but will let Provider know if has a problem doing so.

## 2015-11-30 ENCOUNTER — Other Ambulatory Visit: Payer: Self-pay | Admitting: Internal Medicine

## 2015-12-07 ENCOUNTER — Other Ambulatory Visit (INDEPENDENT_AMBULATORY_CARE_PROVIDER_SITE_OTHER): Payer: BLUE CROSS/BLUE SHIELD

## 2015-12-07 DIAGNOSIS — D649 Anemia, unspecified: Secondary | ICD-10-CM

## 2015-12-07 LAB — FECAL OCCULT BLOOD, IMMUNOCHEMICAL: FECAL OCCULT BLD: NEGATIVE

## 2016-01-05 ENCOUNTER — Telehealth: Payer: Self-pay | Admitting: Internal Medicine

## 2016-01-05 NOTE — Telephone Encounter (Signed)
No, we stopped this when we started NovoLog.

## 2016-01-05 NOTE — Telephone Encounter (Signed)
Pt has requested a refill of Glipizide. I do not see this medication in pt's med list. Please advise.

## 2016-01-05 NOTE — Telephone Encounter (Signed)
[facility] [place] [place] - [address] [phone] (Phone) [phone] (Fax)

## 2016-01-06 NOTE — Telephone Encounter (Signed)
Called pt and lvm advising her per Dr Charlean SanfilippoGherghe's message; advised her she was to stop the Glipizide when she started the Novolog. Advised pt to call with any questions.

## 2016-01-11 ENCOUNTER — Ambulatory Visit: Payer: BLUE CROSS/BLUE SHIELD | Admitting: Family

## 2016-01-15 ENCOUNTER — Other Ambulatory Visit: Payer: Self-pay | Admitting: Internal Medicine

## 2016-02-07 ENCOUNTER — Ambulatory Visit (INDEPENDENT_AMBULATORY_CARE_PROVIDER_SITE_OTHER): Payer: BLUE CROSS/BLUE SHIELD | Admitting: Internal Medicine

## 2016-02-07 ENCOUNTER — Encounter: Payer: Self-pay | Admitting: Internal Medicine

## 2016-02-07 VITALS — BP 138/78 | HR 86 | Ht 62.0 in | Wt 171.8 lb

## 2016-02-07 DIAGNOSIS — E1065 Type 1 diabetes mellitus with hyperglycemia: Secondary | ICD-10-CM | POA: Diagnosis not present

## 2016-02-07 LAB — POCT GLYCOSYLATED HEMOGLOBIN (HGB A1C): Hemoglobin A1C: 7.6

## 2016-02-07 MED ORDER — METFORMIN HCL ER 500 MG PO TB24: 1000.0000 mg | ORAL_TABLET | Freq: Two times a day (BID) | ORAL | Status: DC

## 2016-02-07 MED ORDER — INSULIN PEN NEEDLE 32G X 4 MM MISC: Status: DC

## 2016-02-07 NOTE — Patient Instructions (Addendum)
Please stop:  - JanuMet XR 50-1000 mg 2x a day   Start Metformin XR 1000 mg in am and 1000 mg with dinner.  Please continue: - Toujeo 16 units at bedtime. - NovoLog 3x a day, 15 min before a meal: 1:20 insulin to carb ratio - use up to 8 units with [place] food However, if you plan to be active after dinner: take 1/2 insulin dose - Novolog Sliding scale:  - 150-175: + 1 unit  - 176-200: + 2 units  - 201-225: + 3 units  - >225: + 4 units   If you plan to be more active, decrease Toujeo to 12 units and keep the Novolog at 4 units.  Please return in 1.5 months with your sugar log.

## 2016-02-07 NOTE — Progress Notes (Signed)
Patient ID: Kathryn Rogers, female   DOB: Dec 24, 1960, 55 y.o.   MRN: [id number]  HPI: Kathryn Rogers is a 55 y.o.-year-old female, returning for management of DM1, dx in 2010, but dx'ed at DM1 in 04/2015, insulin-dependent since dx, uncontrolled, without complications. Last visit 3 mo ago.  Last hemoglobin A1c was: Lab Results  Component Value Date   HGBA1C 7.4 10/26/2015   HGBA1C 7.7 07/28/2015   HGBA1C 8.0 04/27/2015   Pt is on a regimen of: - JanuMet XR 50-1000 mg 2x a day - before b'fast and in the pm. She was off the medicine in the past however she noticed that her sugars were higher at that time.  - Toujeo 25 >> 20 >> 16 units in am - NovoLog 3x a day, 15 min before a meal - restarted 11/2014: 1:20 ICR >> 4-6 units per meal, except 8 units with [place] food - Novolog Sliding scale:  - 150-175: + 1 unit  - 176-200: + 2 units  - 201-225: + 3 units  - 226-250: + 4 units  - 251-275: + 5 units - 276-300: + 6 units We stopped Glipizide 11/2014 when started NovoLog. Was on Novolog before - 3x a day, before meals - 1 Units per carb choice (15 g carbs), ave 3 units per meal Takes alphalipoic acid. Used to take Cinammon.  Pt checks her sugars 2x a day and they are: - am: 73, 81-213 >> 56, 62-153, 226 >> 62-161, 229 >> 44, 58, 63, 69-167, 285 >> 87-259, 322 - 2h after b'fast: n/c >> 373 (forget meds that am) >> 179-294 >> n/c>> 88, 225 >> n/c - before lunch: n/c >> 167 >> 145-175, 320 >> 54, 269 >> 56, 91-130 >> 61-66 >> 74-189, 233 - 2h after lunch: 204-330 >>  79, 142-266 >> 43, 108, 167 >> 55, 63-79 >> 71, 345 >> 69, 89 - before dinner: 48, 72-239 >> 48, 72-239 >> >> 52, 62-173, 241 >> 120, 130 >> 53, 91-93 >> 63-161 - 2h after dinner: 129-279 >> 54, 74-285, 332 >> 72-182, 298 (sweet tea) >> 72-290, 394 >> 64-256, 350 (forgot med) - bedtime: 48 x1 >> n/c >> 48, 92-216 >> 46, 67-415 (sweet tea) >> 50-166 >> 56-135, 203 - nighttime: n/c >> 193 >> 40-59 >> 37 (in [place]) >> 44-48 + lows, but  less than before. Lowest sugar was 40s; she has hypoglycemia awareness at 80.  Highest sugar was in the 300s >> 415 >> 394 >> 350  Glucometer: One Touch mini  Pt's meals are: - Breakfast: cereal or carnation instant b'fast - Lunch: grilled salmon, salad + apple - Dinner: grille chicken, grilled grilled, baked potatoes - Snacks: 2 maybe  She walks for exercise - 20 min 2x a day - 2 mi a day; + swimming.   - no CKD, last BUN/creatinine:  Lab Results  Component Value Date   BUN 10 11/14/2015   CREATININE 0.69 11/14/2015   - last set of lipids: Lab Results  Component Value Date   CHOL 150 11/14/2015   HDL 46.80 11/14/2015   LDLCALC 75 11/14/2015   TRIG 143.0 11/14/2015   CHOLHDL 3 11/14/2015   - last eye exam was in 12/2014. [facility]. No DR.  - no numbness and tingling in her feet. On ASA 81.  She went to [employer] in 11/2015.  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  ROS: Constitutional: no Weight loss, no  fatigue Eyes: no blurry vision, no xerophthalmia ENT: no sore throat,  no nodules palpated in throat, no dysphagia/odynophagia, no hoarseness Cardiovascular: no CP/SOB/palpitations/leg swelling Respiratory: no cough/SOB Gastrointestinal: no N/V/D/C Musculoskeletal: no muscle/joint aches Skin: no rashes Neurological: no tremors/numbness/tingling/dizziness  I reviewed pt's medications, allergies, PMH, social hx, family hx, and changes were documented in the history of present illness. Otherwise, unchanged from my initial visit note: Past Medical History  Diagnosis Date  . Migraine   . Diabetes mellitus without complication (HCC)   . Hypertension   . History of rheumatic fever     age 65  . Hyperlipidemia   . Diverticulosis    Past Surgical History  Procedure Laterality Date  . Cholecystectomy  1993  . Abdominal hysterectomy  1991  . Lumbar disc surgery  2004    L5, discectomy and fusion  . Cesarean section      x 2   History   Social History  .  Marital Status: Married    Spouse Name: N/A   Social History Main Topics  . Smoking status: Never Smoker   . Smokeless tobacco: Never Used  . Alcohol Use: No  . Drug Use: No   Social History Narrative   Moved from [place] 2016, originally from [facility] (parents/sisters in [place])   Homemaker   Completed 12th grade   2 daughter (6 grandchildren) one grandchild lives with her.   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   Current Outpatient Prescriptions on File Prior to Visit  Medication Sig Dispense Refill  . amLODipine (NORVASC) 5 MG tablet Take 1 tablet (5 mg total) by mouth daily. 90 tablet 5  . amoxicillin (AMOXIL) 500 MG capsule Reported on 11/11/2015  5  . aspirin EC 81 MG tablet Take 81 mg by mouth daily.    . ferrous sulfate 325 (65 FE) MG tablet Take 1 tablet (325 mg total) by mouth 2 (two) times daily with a meal.  3  . fluticasone (FLONASE) 50 MCG/ACT nasal spray Place 1 spray into both nostrils daily. As needed 16 g 5  . glucose blood test strip Use as instructed to check blood sugar 4 times daily. (One Touch Ultra Blue Test) 135 each 5  . insulin aspart (NOVOLOG FLEXPEN) 100 UNIT/ML FlexPen Inject 4-8 Units into the skin 3 (three) times daily with meals. (Patient taking differently: Inject 6 Units into the skin 3 (three) times daily with meals. ) 15 mL 2  . Insulin Glargine (TOUJEO SOLOSTAR) 300 UNIT/ML SOPN Inject 16 Units into the skin at bedtime. 6 pen 2  . Insulin Pen Needle 31G X 8 MM MISC Use to give insulin injections 3-4 times daily 400 each 3  . JANUMET XR 50-1000 MG TB24 TAKE ONE TABLET BY MOUTH TWICE DAILY 180 tablet 0  . Lancets (ONETOUCH ULTRASOFT) lancets USE ONE LANCET TO CHECK GLUCOSE 4 TIMES DAILY 400 each 1  . NOVOLOG FLEXPEN 100 UNIT/ML FlexPen INJECT 3-5 UNITS SUBCUTANEOUSLY THREE TIMES DAILY WITH MEALS 15 mL 2  . ondansetron (ZOFRAN ODT) 4 MG disintegrating tablet Take  1 tablet (4 mg total) by mouth every 8 (eight) hours as needed for nausea or vomiting. 20 tablet 0  . SUMAtriptan (IMITREX) 100 MG tablet Take 1 tablet (100 mg total) by mouth as needed. May repeat in 2 hours if headache persists or recurs. 10 tablet 5   No current facility-administered medications on file prior to visit.   Allergies  Allergen  Reactions  . Codeine Other (See Comments)    Dizziness, n/v, head itches   Family History  Problem Relation Age of Onset  . Hypertension Father   . Cancer Paternal Aunt     breast  . Alcohol abuse Paternal Grandfather   . Cancer Cousin     breast   PE: BP 138/78 mmHg  Pulse 86  Ht 5\' 2"  (1.575 m)  Wt 171 lb 12.8 oz (77.928 kg)  BMI 31.41 kg/m2  SpO2 96% Body mass index is 31.41 kg/(m^2). Wt Readings from Last 3 Encounters:  02/07/16 171 lb 12.8 oz (77.928 kg)  11/14/15 168 lb 6.4 oz (76.386 kg)  10/26/15 169 lb (76.658 kg)   Constitutional: overweight, in NAD Eyes: PERRLA, EOMI, no exophthalmos ENT: moist mucous membranes, no thyromegaly, no cervical lymphadenopathy Cardiovascular: RRR, No MRG Respiratory: CTA B Gastrointestinal: abdomen soft, NT, ND, BS+ Musculoskeletal: no deformities, strength intact in all 4 Skin: moist, warm, no rashes Neurological: no tremor with outstretched hands, DTR normal in all 4  ASSESSMENT: 1. DM1, uncontrolled, without complications  She had negative pancreatic ABs but undetectable C-pp: Component     Latest Ref Rng 12/15/2014 04/27/2015  C-Peptide     0.80 - 3.90 ng/mL <0.05 (L) <0.05 (L)  Glucose, Fasting     65 - 99 mg/dL 884118 (H) 75  Glutamic Acid Decarb Ab     <5 IU/mL <5   Pancreatic Islet Cell Antibody     < 5 JDF Units <5    PLAN:  1. Complex patient with long-standing, uncontrolled diabetes, on a regimen of basal insulin (Toujeo) + JanuMet + mealtime insulin. Sugars are more variable at this visit: more lows and high CBGs and more unpredictable. - A C-peptide was undetectable >> she  has DM1 rather than DM2, however, we  continued JanuMet along with the insulin, as she tried to stop it in the past and this elevated her sugars. She is aware that she needs to stay very well-hydrated while on metformin.  - At last visit, she was having lows at night >> decreased Toujeo dose further. I also advised her to use more insulin with [place] food as her sugars are 200-300s after this. - she has lower sugars at bedtime and sugars in the 40s at night, especially if she goes swimming after dinner >> will reduce Novolog with dinner if she plans to go swimming afterwards - will also stop JanuMet and start Metformin XR 1000 mg bid to hopefully improve am sugars, which are now higher, possibly 2/2 taking basal insulin in am (we cannot move it at night 2/2 nighttime lows) and due to rebound hyperglycemia from lows at night - I suggested to:  Patient Instructions  Please stop:  - JanuMet XR 50-1000 mg 2x a day   Start Metformin XR 1000 mg in am and 1000 mg with dinner.  Please continue: - Toujeo 16 units at bedtime. - NovoLog 3x a day, 15 min before a meal: 1:20 insulin to carb ratio - use up to 8 units with [place] food However, if you plan to be active after dinner: take 1/2 insulin dose - Novolog Sliding scale:  - 150-175: + 1 unit  - 176-200: + 2 units  - 201-225: + 3 units  - >225: + 4 units   If you plan to be more active, decrease Toujeo to 12 units and keep the Novolog at 4 units.  Please return in 1.5 months with your sugar log.   -  check Hba1c today >> 7.6% (slightly worse) - UTD with eye exams, flu and PNA vaccines - continue checking sugars at different times of the day - check 3 times a day, rotating checks - Return to clinic in 1.5 mo with sugar log   - time spent with the patient: 40 min, of which >50% was spent reviewing her labs, sugar log, insulin doses, and developing a plan to avoid further hypoglycemia and hyperglycemiat; she had a number of questions which I  addressed.

## 2016-02-08 ENCOUNTER — Other Ambulatory Visit: Payer: Self-pay | Admitting: Internal Medicine

## 2016-02-09 ENCOUNTER — Ambulatory Visit (INDEPENDENT_AMBULATORY_CARE_PROVIDER_SITE_OTHER): Payer: BLUE CROSS/BLUE SHIELD | Admitting: Family

## 2016-02-09 ENCOUNTER — Encounter: Payer: Self-pay | Admitting: Family

## 2016-02-09 VITALS — BP 136/67 | HR 88 | Temp 98.2°F | Resp 16 | Ht 60.0 in | Wt 171.4 lb

## 2016-02-09 DIAGNOSIS — M545 Low back pain: Secondary | ICD-10-CM

## 2016-02-09 DIAGNOSIS — D509 Iron deficiency anemia, unspecified: Secondary | ICD-10-CM

## 2016-02-09 DIAGNOSIS — M25531 Pain in right wrist: Secondary | ICD-10-CM

## 2016-02-09 LAB — CBC WITH DIFFERENTIAL/PLATELET
BASOS PCT: 0.3 % (ref 0.0–3.0)
Basophils Absolute: 0 10*3/uL (ref 0.0–0.1)
EOS ABS: 0.2 10*3/uL (ref 0.0–0.7)
Eosinophils Relative: 2 % (ref 0.0–5.0)
HEMATOCRIT: 35.9 % — AB (ref 36.0–46.0)
Hemoglobin: 11.1 g/dL — ABNORMAL LOW (ref 12.0–15.0)
LYMPHS PCT: 19.3 % (ref 12.0–46.0)
Lymphs Abs: 2 10*3/uL (ref 0.7–4.0)
MCHC: 30.8 g/dL (ref 30.0–36.0)
MCV: 71.8 fl — AB (ref 78.0–100.0)
MONOS PCT: 4 % (ref 3.0–12.0)
Monocytes Absolute: 0.4 10*3/uL (ref 0.1–1.0)
NEUTROS ABS: 7.7 10*3/uL (ref 1.4–7.7)
Neutrophils Relative %: 74.4 % (ref 43.0–77.0)
PLATELETS: 433 10*3/uL — AB (ref 150.0–400.0)
RBC: 4.99 Mil/uL (ref 3.87–5.11)
RDW: 17.9 % — AB (ref 11.5–15.5)
WBC: 10.4 10*3/uL (ref 4.0–10.5)

## 2016-02-09 LAB — IRON: Iron: 20 ug/dL — ABNORMAL LOW (ref 42–145)

## 2016-02-09 LAB — FERRITIN: Ferritin: 6.1 ng/mL — ABNORMAL LOW (ref 10.0–291.0)

## 2016-02-09 MED ORDER — MELOXICAM 7.5 MG PO TABS: 7.5000 mg | ORAL_TABLET | Freq: Every day | ORAL | Status: DC

## 2016-02-09 NOTE — Progress Notes (Signed)
Subjective:    Patient ID: Kathryn Rogers, female    DOB: 02/13/1961, 55 y.o.   MRN: [id number]  HPI  Ms.Pohl is a 55 yr old female who presents today with chief complaint of low back pain. Also reports pain radiates into the R hip pain x 6 months.  She is s/p L4-5 fusion 2004.  She reports that she has some chronic "soreness" in the lumbar back.  Pain is worse that her chronic symptom. She uses otc pain patches which help at night.   R wrist pain- reports that she has had right wrist pain for several months.   Review of Systems See HPI  Past Medical History  Diagnosis Date  . Migraine   . Diabetes mellitus without complication (HCC)   . Hypertension   . History of rheumatic fever     age 89  . Hyperlipidemia   . Diverticulosis      Social History   Social History  . Marital Status: Married    Spouse Name: N/A  . Number of Children: N/A  . Years of Education: N/A   Occupational History  . Not on file.   Social History Main Topics  . Smoking status: Never Smoker   . Smokeless tobacco: Never Used  . Alcohol Use: No  . Drug Use: No  . Sexual Activity: Not on file   Other Topics Concern  . Not on file   Social History Narrative   [place], [place] ([place]sisters in [place]   Homemaker   Completed 12th grade   2 daughter (6 grandchildren) one grandchild lives with her.   [place] and [place]   Enjoys spending time with her 54 year old grand-daughter   Previously worked in [employer].    Past Surgical History  Procedure Laterality Date  . Cholecystectomy  1993  . Abdominal hysterectomy  1991  . Lumbar disc surgery  2004    L5, discectomy and fusion  . Cesarean section      x 2    Family History  Problem Relation Age of Onset  . Hypertension Father   . Cancer Paternal Aunt     breast  . Alcohol abuse Paternal Grandfather   . Cancer Cousin     breast    Allergies  Allergen Reactions  . Codeine Other  (See Comments)    Dizziness, n/v, head itches    Current Outpatient Prescriptions on File Prior to Visit  Medication Sig Dispense Refill  . amLODipine (NORVASC) 5 MG tablet Take 1 tablet (5 mg total) by mouth daily. 90 tablet 5  . amoxicillin (AMOXIL) 500 MG capsule Takes for dental procedures  5  . aspirin EC 81 MG tablet Take 81 mg by mouth daily.    . ferrous sulfate 325 (65 FE) MG tablet Take 1 tablet (325 mg total) by mouth 2 (two) times daily with a meal.  3  . fluticasone (FLONASE) 50 MCG/ACT nasal spray Place 1 spray into both nostrils daily. As needed 16 g 5  . glucose blood test strip Use as instructed to check blood sugar 4 times daily. (One Touch Ultra Blue Test) 135 each 5  . insulin aspart (NOVOLOG FLEXPEN) 100 UNIT/ML FlexPen Inject 4-8 Units into the skin 3 (three) times daily with meals. (Patient taking differently: Inject 6 Units into the skin 3 (three) times daily with meals. ) 15 mL 2  . Insulin Glargine (TOUJEO SOLOSTAR) 300 UNIT/ML SOPN  Inject 16 Units into the skin at bedtime. 6 pen 2  . Insulin Pen Needle (CAREFINE PEN NEEDLES) 32G X 4 MM MISC Use 4x a day 300 each 3  . Lancets (ONETOUCH ULTRASOFT) lancets USE ONE LANCET TO CHECK GLUCOSE 4 TIMES DAILY 400 each 1  . metFORMIN (GLUCOPHAGE-XR) 500 MG 24 hr tablet Take 2 tablets (1,000 mg total) by mouth 2 (two) times daily with a meal. 360 tablet 3  . NOVOLOG FLEXPEN 100 UNIT/ML FlexPen INJECT 3-5 UNITS SUBCUTANEOUSLY THREE TIMES DAILY WITH MEALS 15 mL 2  . ondansetron (ZOFRAN ODT) 4 MG disintegrating tablet Take 1 tablet (4 mg total) by mouth every 8 (eight) hours as needed for nausea or vomiting. 20 tablet 0  . SUMAtriptan (IMITREX) 100 MG tablet Take 1 tablet (100 mg total) by mouth as needed. May repeat in 2 hours if headache persists or recurs. 10 tablet 5  . TOUJEO SOLOSTAR 300 UNIT/ML SOPN INJECT 25 UNITS SUBCUTANEOUSLY ONCE DAILY BEFORE BREAKFAST 6 pen 2   No current facility-administered medications on file prior  to visit.    BP 136/67 mmHg  Pulse 88  Temp(Src) 98.2 F (36.8 C) (Oral)  Resp 16  Ht 5' (1.524 m)  Wt 171 lb 6.4 oz (77.747 kg)  BMI 33.47 kg/m2  SpO2 100%       Objective:   Physical Exam  Constitutional: She appears well-developed and well-nourished.  Cardiovascular: Normal rate, regular rhythm and normal heart sounds.   No murmur heard. Pulmonary/Chest: Effort normal and breath sounds normal. No respiratory distress. She has no wheezes.  Neurological:  Reflex Scores:      Patellar reflexes are 2+ on the right side. Bilateral LE strength is 5/5 Right wrist no swelling/erythema  Psychiatric: She has a normal mood and affect. Her behavior is normal. Judgment and thought content normal.          Assessment & Plan:  Low back pain- hx of DDD, lumbar fusion. Will give trial of meloxicam. If symptoms worsen or do not improve, will refer to neurosurgery for further evaluation.  R wrist pain- trial of meloxicam. Consider referral to sports med if symptoms worsen or do not improve.

## 2016-02-09 NOTE — Addendum Note (Signed)
Addended by: Harley AltoPRICE, KRISTY M on: 02/09/2016 09:36 AM   Modules accepted: Orders

## 2016-02-09 NOTE — Patient Instructions (Signed)
Begin meloxicam once daily for next 1-2 weeks for your back pain. Let me know if pain worsens or if it does not improve and we will plan referral to neurosurgery.

## 2016-02-09 NOTE — Progress Notes (Signed)
Pre visit review using our clinic review tool, if applicable. No additional management support is needed unless otherwise documented below in the visit note. 

## 2016-02-10 ENCOUNTER — Telehealth: Payer: Self-pay | Admitting: Family

## 2016-02-10 ENCOUNTER — Other Ambulatory Visit: Payer: Self-pay | Admitting: *Deleted

## 2016-02-10 DIAGNOSIS — D509 Iron deficiency anemia, unspecified: Secondary | ICD-10-CM

## 2016-02-10 NOTE — Telephone Encounter (Signed)
Iron is low, is she taking iron 325mg  bid? If not, need to restart.  Repeat serum iron, ferritin, cbc diff in 3 months please.

## 2016-02-10 NOTE — Telephone Encounter (Signed)
Pt notified of results and verbalized understanding. She is taking iron supplement as advised. 3 month lab appt scheduled and future orders placed.   Pt requests refill of Zofran. Last filled 04/08/15, #20, 0 RF. Please advise.

## 2016-02-13 NOTE — Telephone Encounter (Signed)
I would like her to increase to TID please.

## 2016-02-14 ENCOUNTER — Ambulatory Visit (HOSPITAL_BASED_OUTPATIENT_CLINIC_OR_DEPARTMENT_OTHER)
Admission: RE | Admit: 2016-02-14 | Discharge: 2016-02-14 | Disposition: A | Discharge status: Home / Self Care | Payer: BLUE CROSS/BLUE SHIELD | Source: Ambulatory Visit | Attending: Family | Admitting: Family

## 2016-02-14 ENCOUNTER — Ambulatory Visit (HOSPITAL_BASED_OUTPATIENT_CLINIC_OR_DEPARTMENT_OTHER): Payer: BLUE CROSS/BLUE SHIELD

## 2016-02-14 DIAGNOSIS — Z1231 Encounter for screening mammogram for malignant neoplasm of breast: Secondary | ICD-10-CM | POA: Insufficient documentation

## 2016-02-14 DIAGNOSIS — Z1239 Encounter for other screening for malignant neoplasm of breast: Secondary | ICD-10-CM | POA: Diagnosis not present

## 2016-02-14 NOTE — Telephone Encounter (Signed)
Left message for pt to return my call.

## 2016-02-14 NOTE — Telephone Encounter (Signed)
Notified pt and she voices understanding. 

## 2016-02-24 ENCOUNTER — Other Ambulatory Visit: Payer: BLUE CROSS/BLUE SHIELD

## 2016-03-01 ENCOUNTER — Telehealth: Payer: Self-pay | Admitting: Family

## 2016-03-01 MED ORDER — ONDANSETRON 4 MG PO TBDP: 4.0000 mg | ORAL_TABLET | Freq: Three times a day (TID) | ORAL | Status: DC | PRN

## 2016-03-01 NOTE — Telephone Encounter (Signed)
Pt is requesting refill on Zofran for nausea w/ her migraines. Please advise.

## 2016-03-23 ENCOUNTER — Other Ambulatory Visit: Payer: Self-pay

## 2016-03-23 ENCOUNTER — Telehealth: Payer: Self-pay | Admitting: Internal Medicine

## 2016-03-23 MED ORDER — INSULIN GLARGINE 300 UNIT/ML ~~LOC~~ SOPN: 16.0000 [IU] | PEN_INJECTOR | Freq: Every day | SUBCUTANEOUS | 2 refills | Status: DC

## 2016-03-23 NOTE — Telephone Encounter (Signed)
Patient need a refill TOUJEO SOLOSTAR 300 UNIT/ML SOPN.  [facility] [address] [place], [place] - [address] [phone] (Phone) [phone] (Fax)

## 2016-03-25 ENCOUNTER — Other Ambulatory Visit: Payer: Self-pay | Admitting: Family

## 2016-03-26 ENCOUNTER — Encounter: Payer: Self-pay | Admitting: Family

## 2016-03-26 ENCOUNTER — Ambulatory Visit (INDEPENDENT_AMBULATORY_CARE_PROVIDER_SITE_OTHER): Payer: BLUE CROSS/BLUE SHIELD | Admitting: Family

## 2016-03-26 VITALS — BP 128/82 | HR 90 | Temp 98.3°F | Resp 18 | Ht 60.0 in | Wt 171.4 lb

## 2016-03-26 DIAGNOSIS — G43909 Migraine, unspecified, not intractable, without status migrainosus: Secondary | ICD-10-CM | POA: Diagnosis not present

## 2016-03-26 DIAGNOSIS — E1065 Type 1 diabetes mellitus with hyperglycemia: Secondary | ICD-10-CM

## 2016-03-26 DIAGNOSIS — Z0000 Encounter for general adult medical examination without abnormal findings: Secondary | ICD-10-CM

## 2016-03-26 DIAGNOSIS — E2839 Other primary ovarian failure: Secondary | ICD-10-CM

## 2016-03-26 LAB — VITAMIN D 25 HYDROXY (VIT D DEFICIENCY, FRACTURES): VITD: 38.56 ng/mL (ref 30.00–100.00)

## 2016-03-26 MED ORDER — KETOROLAC TROMETHAMINE 60 MG/2ML IM SOLN
60.0000 mg | Freq: Once | INTRAMUSCULAR | Status: AC
  Administered 2016-03-26: 60 mg via INTRAMUSCULAR

## 2016-03-26 MED ORDER — MELOXICAM 7.5 MG PO TABS: 7.5000 mg | ORAL_TABLET | Freq: Every day | ORAL | 0 refills | Status: DC | PRN

## 2016-03-26 MED ORDER — METOPROLOL SUCCINATE ER 50 MG PO TB24: 50.0000 mg | ORAL_TABLET | Freq: Every day | ORAL | 2 refills | Status: DC

## 2016-03-26 MED ORDER — SUMATRIPTAN SUCCINATE 100 MG PO TABS: 100.0000 mg | ORAL_TABLET | ORAL | 5 refills | Status: DC | PRN

## 2016-03-26 NOTE — Progress Notes (Signed)
Pre visit review using our clinic review tool, if applicable. No additional management support is needed unless otherwise documented below in the visit note. 

## 2016-03-26 NOTE — Assessment & Plan Note (Signed)
Discussed with patient that her undetectable c peptide along with insulin use places her in the Type 1 category. Advised that she discuss her concerns further with endocrinology.

## 2016-03-26 NOTE — Patient Instructions (Signed)
Stop amlodipine.  Begin toprol xl for blood pressure and migraine prevention.   You may use imitrex as needed for headache.

## 2016-03-26 NOTE — Progress Notes (Signed)
Subjective:    Patient ID: Kathryn Rogers, female    DOB: [birth year], 55 y.o.   MRN: [id number]  HPI  Kathryn Rogers is a 55 yr old female who presents today with chief complaint of migraine. Migraine has been present x 2 days.  Reports that her HA is behind both eyes worse on the left side.  She is out of imitrex and has not been able to use imitrex this mother.  She reports 4 migraines a week. She reports + nausea/photophobia.    Reports that her dentist wants her to have a vit D level checked. She is menopausal, reports that she had TAH/BSO at age 32. Has not had a bone density in 13 years.     Review of Systems     Past Medical History:  Diagnosis Date  . Diabetes mellitus without complication (HCC)   . Diverticulosis   . History of rheumatic fever    age 81  . Hyperlipidemia   . Hypertension   . Migraine      Social History   Social History  . Marital status: Married    Spouse name: N/A  . Number of children: N/A  . Years of education: N/A   Occupational History  . Not on file.   Social History Main Topics  . Smoking status: Never Smoker  . Smokeless tobacco: Never Used  . Alcohol use No  . Drug use: No  . Sexual activity: Not on file   Other Topics Concern  . Not on file   Social History Narrative   Moved from [place] 2016, originally from [facility] (parents/sisters in [place])   Homemaker   Completed 12th grade   2 daughter (6 grandchildren) one grandchild lives with her.   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    Past Surgical History:  Procedure Laterality Date  . ABDOMINAL HYSTERECTOMY  1991  . CESAREAN SECTION     x 2  . CHOLECYSTECTOMY  1993  . LUMBAR DISC SURGERY  2004   L5, discectomy and fusion    Family History  Problem Relation Age of Onset  . Hypertension Father   . Cancer Paternal Aunt     breast  . Alcohol abuse Paternal Grandfather   . Cancer  Cousin     breast    Allergies  Allergen Reactions  . Codeine Other (See Comments)    Dizziness, n/v, head itches    Current Outpatient Prescriptions on File Prior to Visit  Medication Sig Dispense Refill  . amLODipine (NORVASC) 5 MG tablet Take 1 tablet (5 mg total) by mouth daily. 90 tablet 5  . amoxicillin (AMOXIL) 500 MG capsule Takes for dental procedures  5  . aspirin EC 81 MG tablet Take 81 mg by mouth daily.    . ferrous sulfate 325 (65 FE) MG tablet Take 1 tablet (325 mg total) by mouth 2 (two) times daily with a meal. (Patient taking differently: Take 325 mg by mouth 3 (three) times daily with meals. )  3  . fluticasone (FLONASE) 50 MCG/ACT nasal spray Place 1 spray into both nostrils daily. As needed 16 g 5  . glucose blood test strip Use as instructed to check blood sugar 4 times daily. (One Touch Ultra Blue Test) 135 each 5  . insulin aspart (NOVOLOG FLEXPEN) 100 UNIT/ML FlexPen Inject 4-8 Units into the skin 3 (three)  times daily with meals. (Patient taking differently: Inject 20 Units into the skin daily. ) 15 mL 2  . Insulin Pen Needle (CAREFINE PEN NEEDLES) 32G X 4 MM MISC Use 4x a day 300 each 3  . Lancets (ONETOUCH ULTRASOFT) lancets USE ONE LANCET TO CHECK GLUCOSE 4 TIMES DAILY 400 each 1  . meloxicam (MOBIC) 7.5 MG tablet Take 1 tablet (7.5 mg total) by mouth daily. 14 tablet 0  . metFORMIN (GLUCOPHAGE-XR) 500 MG 24 hr tablet Take 2 tablets (1,000 mg total) by mouth 2 (two) times daily with a meal. 360 tablet 3  . ondansetron (ZOFRAN ODT) 4 MG disintegrating tablet Take 1 tablet (4 mg total) by mouth every 8 (eight) hours as needed for nausea or vomiting. 20 tablet 0  . TOUJEO SOLOSTAR 300 UNIT/ML SOPN INJECT 25 UNITS SUBCUTANEOUSLY ONCE DAILY BEFORE BREAKFAST 6 pen 2   No current facility-administered medications on file prior to visit.     BP 128/82   Pulse 90   Temp 98.3 F (36.8 C) (Oral)   Resp 18   Ht 5' (1.524 m)   Wt 171 lb 6.4 oz (77.7 kg)   SpO2 98%  Comment: room air  BMI 33.47 kg/m    Objective:   Physical Exam  Constitutional: She appears well-developed and well-nourished.  HENT:  Head: Normocephalic and atraumatic.  Eyes: Pupils are equal, round, and reactive to light.  Cardiovascular: Normal rate, regular rhythm and normal heart sounds.   No murmur Rogers. Pulmonary/Chest: Effort normal and breath sounds normal. No respiratory distress. She has no wheezes.  Musculoskeletal: She exhibits no edema.  Neurological: She is alert. No cranial nerve deficit. She exhibits normal muscle tone.  Skin: Skin is warm and dry.  Psychiatric: She has a normal mood and affect. Her behavior is normal. Judgment and thought content normal.          Assessment & Plan:

## 2016-03-26 NOTE — Assessment & Plan Note (Signed)
Uncontrolled. IM toradol today. Refill imitrex.  Will d/c amlodipine and plan to switch her to toprol XL (beta blocker) for migraine prophylaxis as well as blood pressure control. Will have her follow back up in 2 weeks.

## 2016-03-26 NOTE — Addendum Note (Signed)
Addended by: Mervin KungFERGERSON, Byanka Landrus A on: 03/26/2016 09:39 AM   Modules accepted: Orders

## 2016-03-29 ENCOUNTER — Telehealth: Payer: Self-pay | Admitting: Family

## 2016-03-29 LAB — HM DIABETES EYE EXAM

## 2016-03-29 NOTE — Telephone Encounter (Signed)
-----   Message from Destiny A Best sent at 03/29/2016 11:26 AM EDT ----- Regarding: Bone Density Hello Kathryn Rogers,  The above patient's telephone number has a block for blocked numbers so we are unable to reach the patient. If she has an appointment with you may you please send her down to schedule her bone density.   Thank you, Destiny [facility]

## 2016-04-02 ENCOUNTER — Ambulatory Visit (HOSPITAL_BASED_OUTPATIENT_CLINIC_OR_DEPARTMENT_OTHER)
Admission: RE | Admit: 2016-04-02 | Discharge: 2016-04-02 | Disposition: A | Discharge status: Home / Self Care | Payer: BLUE CROSS/BLUE SHIELD | Source: Ambulatory Visit | Attending: Family | Admitting: Family

## 2016-04-02 DIAGNOSIS — Z780 Asymptomatic menopausal state: Secondary | ICD-10-CM | POA: Diagnosis not present

## 2016-04-02 DIAGNOSIS — E2839 Other primary ovarian failure: Secondary | ICD-10-CM | POA: Diagnosis present

## 2016-04-02 DIAGNOSIS — E119 Type 2 diabetes mellitus without complications: Secondary | ICD-10-CM | POA: Insufficient documentation

## 2016-04-17 ENCOUNTER — Encounter: Payer: Self-pay | Admitting: Internal Medicine

## 2016-04-17 ENCOUNTER — Encounter: Payer: Self-pay | Admitting: Family

## 2016-04-18 MED ORDER — SUMATRIPTAN SUCCINATE 100 MG PO TABS: 100.0000 mg | ORAL_TABLET | ORAL | 0 refills | Status: DC | PRN

## 2016-04-18 MED ORDER — METOPROLOL SUCCINATE ER 50 MG PO TB24: 50.0000 mg | ORAL_TABLET | Freq: Every day | ORAL | 0 refills | Status: DC

## 2016-04-19 ENCOUNTER — Other Ambulatory Visit: Payer: Self-pay

## 2016-04-19 MED ORDER — METFORMIN HCL ER 500 MG PO TB24: 1000.0000 mg | ORAL_TABLET | Freq: Two times a day (BID) | ORAL | 3 refills | Status: DC

## 2016-04-19 MED ORDER — INSULIN GLARGINE 300 UNIT/ML ~~LOC~~ SOPN: 25.0000 [IU] | PEN_INJECTOR | Freq: Every day | SUBCUTANEOUS | 2 refills | Status: DC

## 2016-04-19 MED ORDER — INSULIN ASPART 100 UNIT/ML FLEXPEN: 4.0000 [IU] | PEN_INJECTOR | Freq: Three times a day (TID) | SUBCUTANEOUS | 2 refills | Status: DC

## 2016-04-19 NOTE — Telephone Encounter (Signed)
Sent in PA for Novolog, Metformin, and Toujeo through covermymeds. Novolog was approved, put in scanning to be in patient chart. Metformin and Toujeo did not need a PA at this time. I advised patient of this and [facility].

## 2016-04-30 ENCOUNTER — Other Ambulatory Visit: Payer: Self-pay

## 2016-04-30 MED ORDER — INSULIN PEN NEEDLE 32G X 4 MM MISC: 3 refills | Status: DC

## 2016-05-09 ENCOUNTER — Ambulatory Visit (INDEPENDENT_AMBULATORY_CARE_PROVIDER_SITE_OTHER): Payer: BLUE CROSS/BLUE SHIELD | Admitting: Internal Medicine

## 2016-05-09 ENCOUNTER — Other Ambulatory Visit: Payer: Self-pay

## 2016-05-09 ENCOUNTER — Encounter: Payer: Self-pay | Admitting: Internal Medicine

## 2016-05-09 VITALS — BP 138/88 | HR 101 | Ht 60.0 in | Wt 174.0 lb

## 2016-05-09 DIAGNOSIS — E1065 Type 1 diabetes mellitus with hyperglycemia: Secondary | ICD-10-CM | POA: Diagnosis not present

## 2016-05-09 LAB — POCT GLYCOSYLATED HEMOGLOBIN (HGB A1C): Hemoglobin A1C: 7.6

## 2016-05-09 MED ORDER — INSULIN ASPART 100 UNIT/ML FLEXPEN: 4.0000 [IU] | PEN_INJECTOR | Freq: Three times a day (TID) | SUBCUTANEOUS | 1 refills | Status: DC

## 2016-05-09 MED ORDER — INSULIN GLARGINE 300 UNIT/ML ~~LOC~~ SOPN: 30.0000 [IU] | PEN_INJECTOR | Freq: Every day | SUBCUTANEOUS | 2 refills | Status: DC

## 2016-05-09 MED ORDER — METFORMIN HCL ER 500 MG PO TB24: 1000.0000 mg | ORAL_TABLET | Freq: Two times a day (BID) | ORAL | 3 refills | Status: DC

## 2016-05-09 NOTE — Addendum Note (Signed)
Addended by: Darene LamerHOMPSON, Janeisha Ryle T on: 05/09/2016 09:13 AM   Modules accepted: Orders

## 2016-05-09 NOTE — Patient Instructions (Addendum)
Please continue - Metformin XR 1000 mg in am and 1000 mg with dinner. - NovoLog 3x a day, 15 min before a meal: 1:6 insulin to carb ratio  However, if you plan to be active after dinner: take 1/2 insulin dose - Novolog Sliding scale:  - 150-175: + 1 unit  - 176-200: + 2 units  - 201-225: + 3 units  - >225: + 4 units   Please split Toujeo into 2 doses: 15 units 2x a day.  Please return in 3 months with your sugar log.

## 2016-05-09 NOTE — Progress Notes (Signed)
Patient ID: Kathryn Rogers, female   DOB: [birth year], 55 y.o.   MRN: [id number]  HPI: Kathryn Rogers is a 55 y.o.-year-old female, returning for management of DM1, dx in 2010, but dx'ed at DM1 in 04/2015, insulin-dependent since dx, uncontrolled, without complications. Last visit 3 mo ago.  She is trying to sell her house so she can move to [place].  Last hemoglobin A1c was: Lab Results  Component Value Date   HGBA1C 7.6 02/07/2016   HGBA1C 7.4 10/26/2015   HGBA1C 7.7 07/28/2015   Pt is on a regimen of: - Metformin XR 1000 mg in am and 1000 mg with dinner. - Toujeo 30 units in am - NovoLog 3x a day, 15 min before a meal: 1:6 insulin to carb ratio  However, if you plan to be active after dinner: take 1/2 insulin dose - Novolog Sliding scale:  - 150-175: + 1 unit  - 176-200: + 2 units  - 201-225: + 3 units  - >225: + 4 units  We stopped Glipizide 11/2014 when started NovoLog. Was on Novolog before - 3x a day, before meals - 1 Units per carb choice (15 g carbs), ave 3 units per meal Takes alphalipoic acid. Used to take Cinammon.  Pt checks her sugars 2x a day and they are: - am: 73, 81-213 >> 56, 62-153, 226 >> 62-161, 229 >> 44, 58, 63, 69-167, 285 >> 87-259, 322 >> 80-207, 221, 332 - 2h after b'fast: n/c >> 373 (forget meds that am) >> 179-294 >> n/c>> 88, 225 >> n/c - before lunch: n/c >> 167 >> 145-175, 320 >> 54, 269 >> 56, 91-130 >> 61-66 >> 74-189, 233 >> 81, 135 - 2h after lunch: 204-330 >>  79, 142-266 >> 43, 108, 167 >> 55, 63-79 >> 71, 345 >> 69, 89 >> n/c - before dinner: 48, 72-239 >> 48, 72-239 >> >> 52, 62-173, 241 >> 120, 130 >> 53, 91-93 >> 63-161 >> 51-191, 228 - 2h after dinner: 129-279 >> 54, 74-285, 332 >> 72-182, 298 (sweet tea) >> 72-290, 394 >> 64-256, 350 (forgot med) >> 66, 115-296 - bedtime: 48 x1 >> n/c >> 48, 92-216 >> 46, 67-415 (sweet tea) >> 50-166 >> 56-135, 203 >> 75, 94-230 - nighttime: n/c >> 193 >> 40-59 >> 37 (in [place]) >> 44-48 >> 40x1, 62 Lowest  sugar was 40; she has hypoglycemia awareness at 80.  Highest sugar was in the 300s >> 415 >> 394 >> 350 >> 300s  Glucometer: One Touch mini  Pt's meals are: - Breakfast: cereal or carnation instant b'fast - Lunch: grilled salmon, salad + apple - Dinner: grille chicken, grilled grilled, baked potatoes - Snacks: 2 maybe  She walks for exercise - 20 min 2x a day - 2 mi a day; + swimming.   - no CKD, last BUN/creatinine:  Lab Results  Component Value Date   BUN 10 11/14/2015   CREATININE 0.69 11/14/2015   - last set of lipids: Lab Results  Component Value Date   CHOL 150 11/14/2015   HDL 46.80 11/14/2015   LDLCALC 75 11/14/2015   TRIG 143.0 11/14/2015   CHOLHDL 3 11/14/2015   - last eye exam was on 03/29/2016. [facility]. No DR.  - no numbness and tingling in her feet. On ASA 81.  She went to [employer] in 11/2015.  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  ROS: Constitutional: no Weight loss, no  fatigue Eyes: no blurry vision, no xerophthalmia ENT: no sore throat, no nodules palpated  in throat, no dysphagia/odynophagia, no hoarseness Cardiovascular: no CP/SOB/palpitations/leg swelling Respiratory: no cough/SOB Gastrointestinal: no N/V/D/C Musculoskeletal: no muscle/joint aches Skin: no rashes Neurological: no tremors/numbness/tingling/dizziness  I reviewed pt's medications, allergies, PMH, social hx, family hx, and changes were documented in the history of present illness. Otherwise, unchanged from my initial visit note: Past Medical History:  Diagnosis Date  . Diabetes mellitus without complication (HCC)   . Diverticulosis   . History of rheumatic fever    age 55  . Hyperlipidemia   . Hypertension   . Migraine    Past Surgical History:  Procedure Laterality Date  . ABDOMINAL HYSTERECTOMY  1991  . CESAREAN SECTION     x 2  . CHOLECYSTECTOMY  1993  . LUMBAR DISC SURGERY  2004   L5, discectomy and fusion   History   Social History  . Marital Status:  Married    Spouse Name: N/A   Social History Main Topics  . Smoking status: Never Smoker   . Smokeless tobacco: Never Used  . Alcohol Use: No  . Drug Use: No   Social History Narrative   Moved from [place] 2016, originally from [facility] (parents/sisters in [place])   Homemaker   Completed 12th grade   2 daughter (6 grandchildren) one grandchild lives with her.   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   Current Outpatient Prescriptions on File Prior to Visit  Medication Sig Dispense Refill  . amoxicillin (AMOXIL) 500 MG capsule Takes for dental procedures  5  . aspirin EC 81 MG tablet Take 81 mg by mouth daily.    . ferrous sulfate 325 (65 FE) MG tablet Take 1 tablet (325 mg total) by mouth 2 (two) times daily with a meal. (Patient taking differently: Take 325 mg by mouth 3 (three) times daily with meals. )  3  . fluticasone (FLONASE) 50 MCG/ACT nasal spray Place 1 spray into both nostrils daily. As needed 16 g 5  . glucose blood test strip Use as instructed to check blood sugar 4 times daily. (One Touch Ultra Blue Test) 135 each 5  . insulin aspart (NOVOLOG FLEXPEN) 100 UNIT/ML FlexPen Inject 4-8 Units into the skin 3 (three) times daily with meals. 15 mL 2  . Insulin Glargine (TOUJEO SOLOSTAR) 300 UNIT/ML SOPN Inject 25 Units into the skin daily before breakfast. 6 pen 2  . Insulin Pen Needle (CAREFINE PEN NEEDLES) 32G X 4 MM MISC Use 4x a day 300 each 3  . Lancets (ONETOUCH ULTRASOFT) lancets USE ONE LANCET TO CHECK GLUCOSE 4 TIMES DAILY 400 each 1  . meloxicam (MOBIC) 7.5 MG tablet Take 1 tablet (7.5 mg total) by mouth daily as needed for pain. 20 tablet 0  . metFORMIN (GLUCOPHAGE-XR) 500 MG 24 hr tablet Take 2 tablets (1,000 mg total) by mouth 2 (two) times daily with a meal. 360 tablet 3  . metoprolol succinate (TOPROL XL) 50 MG 24 hr tablet Take 1 tablet (50 mg total) by mouth daily. Take with  or immediately following a meal. 90 tablet 0  . ondansetron (ZOFRAN ODT) 4 MG disintegrating tablet Take 1 tablet (4 mg total) by mouth every 8 (eight) hours as needed for nausea or vomiting. 20 tablet 0  . SUMAtriptan (IMITREX) 100 MG tablet Take 1 tablet (100 mg total) by mouth as needed. May repeat in 2 hours if headache persists or recurs.  30 tablet 0   No current facility-administered medications on file prior to visit.    Allergies  Allergen Reactions  . Codeine Other (See Comments)    Dizziness, n/v, head itches   Family History  Problem Relation Age of Onset  . Hypertension Father   . Cancer Paternal Aunt     breast  . Alcohol abuse Paternal Grandfather   . Cancer Cousin     breast   PE: BP 138/88 (BP Location: Left Arm, Patient Position: Sitting)   Pulse (!) 101   Ht 5' (1.524 m)   Wt 174 lb (78.9 kg)   SpO2 97%   BMI 33.98 kg/m  Body mass index is 33.98 kg/m. Wt Readings from Last 3 Encounters:  05/09/16 174 lb (78.9 kg)  03/26/16 171 lb 6.4 oz (77.7 kg)  02/09/16 171 lb 6.4 oz (77.7 kg)   Constitutional: overweight, in NAD Eyes: PERRLA, EOMI, no exophthalmos ENT: moist mucous membranes, no thyromegaly, no cervical lymphadenopathy Cardiovascular: RRR, No MRG Respiratory: CTA B Gastrointestinal: abdomen soft, NT, ND, BS+ Musculoskeletal: no deformities, strength intact in all 4 Skin: moist, warm, no rashes Neurological: no tremor with outstretched hands, DTR normal in all 4  ASSESSMENT: 1. DM1, uncontrolled, without complications  She had negative pancreatic ABs but undetectable C-pp: Component     Latest Ref Rng 12/15/2014 04/27/2015  C-Peptide     0.80 - 3.90 ng/mL <0.05 (L) <0.05 (L)  Glucose, Fasting     65 - 99 mg/dL 161118 (H) 75  Glutamic Acid Decarb Ab     <5 IU/mL <5   Pancreatic Islet Cell Antibody     < 5 JDF Units <5    PLAN:  1. Complex patient with long-standing, uncontrolled diabetes, on a regimen of basal insulin (Toujeo) + Metformin +  mealtime insulin. Sugars are more variable at this visit: more high CBGs and more unpredictable. - A C-peptide was undetectable >> she has DM1 rather than DM2, however, we  continued Metformin along with the insulin, as she tried to stop it in the past and this elevated her sugars. She is aware that she needs to stay very well-hydrated while on metformin.  - as sugars are high in am >> will split Toujeo in 2 doses - I again suggested a pump and given her a brochure about the latest [employer] pump >> she will think about it. - I suggested to:  Patient Instructions  Please continue - Metformin XR 1000 mg in am and 1000 mg with dinner. - NovoLog 3x a day, 15 min before a meal: 1:6 insulin to carb ratio  However, if you plan to be active after dinner: take 1/2 insulin dose - Novolog Sliding scale:  - 150-175: + 1 unit  - 176-200: + 2 units  - 201-225: + 3 units  - >225: + 4 units   Please split Toujeo into 2 doses: 15 units 2x a day.  Please return in 3 months with your sugar log.   - check Hba1c today >> 7.6% (stable) - UTD with eye exams, flu vaccine - continue checking sugars at different times of the day - check 3 times a day, rotating checks - Return to clinic in 3 mo with sugar log   Carlus Pavlovristina Esmirna Ravan, MD PhD [facility]

## 2016-05-14 ENCOUNTER — Other Ambulatory Visit: Payer: BLUE CROSS/BLUE SHIELD

## 2016-05-14 ENCOUNTER — Telehealth: Payer: Self-pay | Admitting: Internal Medicine

## 2016-05-14 NOTE — Telephone Encounter (Signed)
janumet XR 50 mg  to be called into she did better with that [facility]

## 2016-05-15 NOTE — Telephone Encounter (Signed)
OK to call in JanuMet XR 50-1000 mg 2x a day #180 with 3 refills. Stop metformin XR when switch to this.

## 2016-05-16 ENCOUNTER — Other Ambulatory Visit: Payer: Self-pay

## 2016-05-16 MED ORDER — SITAGLIPTIN PHOS-METFORMIN HCL 50-1000 MG PO TABS: 1.0000 | ORAL_TABLET | Freq: Two times a day (BID) | ORAL | 3 refills | Status: DC

## 2016-05-16 NOTE — Telephone Encounter (Signed)
Called and left message advising of medication that we had sent in and advised to stop taking metformin, gave call back number if any questions.

## 2016-05-24 ENCOUNTER — Other Ambulatory Visit: Payer: Self-pay | Admitting: Family

## 2016-06-14 ENCOUNTER — Other Ambulatory Visit: Payer: Self-pay | Admitting: Family

## 2016-06-14 MED ORDER — SUMATRIPTAN SUCCINATE 100 MG PO TABS: 100.0000 mg | ORAL_TABLET | ORAL | 0 refills | Status: DC | PRN

## 2016-07-16 ENCOUNTER — Other Ambulatory Visit: Payer: Self-pay | Admitting: Family

## 2016-07-18 MED ORDER — AMLODIPINE BESYLATE 5 MG PO TABS: 5.0000 mg | ORAL_TABLET | Freq: Every day | ORAL | 0 refills | Status: DC

## 2016-07-18 MED ORDER — ONDANSETRON 4 MG PO TBDP: 4.0000 mg | ORAL_TABLET | Freq: Three times a day (TID) | ORAL | 0 refills | Status: DC | PRN

## 2016-07-18 NOTE — Telephone Encounter (Signed)
Notified pt and she voices understanding. [facility] and f/u scheduled for 08/01/16 at 7am.

## 2016-07-18 NOTE — Telephone Encounter (Signed)
D/c metoprolol, begin amlodipine 5mg  PO daily #30, follow up in 2 weeks please.

## 2016-07-18 NOTE — Telephone Encounter (Signed)
Kathryn Rogers-- ondansetron refill was sent. Can you please advise re: pt's BP medication concerns above?

## 2016-08-01 ENCOUNTER — Ambulatory Visit: Payer: BLUE CROSS/BLUE SHIELD | Admitting: Family

## 2016-08-04 ENCOUNTER — Other Ambulatory Visit: Payer: Self-pay | Admitting: Internal Medicine

## 2016-08-07 ENCOUNTER — Telehealth: Payer: Self-pay

## 2016-08-07 NOTE — Telephone Encounter (Signed)
Spoke to patient. She says she has verified with pharmacy that she could use a Novolog coupon that she has for next purchase. So she plans to do that. She will move to [place] on 08/22/16. Patient states if the coupon does not work or expires by the time she needs med, she will call Dr. Reece AgarG for switch to Humalog in the meantime.

## 2016-08-07 NOTE — Telephone Encounter (Signed)
Received letter. Called patient to inquire if ok w/ change from Novolog to Humalog, if so will [facility]. No answer. Left vmail

## 2016-08-08 ENCOUNTER — Ambulatory Visit: Payer: BLUE CROSS/BLUE SHIELD | Admitting: Internal Medicine

## 2016-08-09 ENCOUNTER — Other Ambulatory Visit: Payer: Self-pay | Admitting: Internal Medicine

## 2016-10-11 ENCOUNTER — Other Ambulatory Visit: Payer: Self-pay

## 2016-10-11 ENCOUNTER — Telehealth: Payer: Self-pay

## 2016-10-11 ENCOUNTER — Telehealth: Payer: Self-pay | Admitting: Internal Medicine

## 2016-10-11 MED ORDER — INSULIN ASPART 100 UNIT/ML FLEXPEN: PEN_INJECTOR | SUBCUTANEOUS | 2 refills | Status: DC

## 2016-10-11 MED ORDER — INSULIN GLARGINE 300 UNIT/ML ~~LOC~~ SOPN: 30.0000 [IU] | PEN_INJECTOR | Freq: Every day | SUBCUTANEOUS | 2 refills | Status: DC

## 2016-10-11 MED ORDER — METFORMIN HCL ER 500 MG PO TB24: 1000.0000 mg | ORAL_TABLET | Freq: Two times a day (BID) | ORAL | 3 refills | Status: DC

## 2016-10-11 NOTE — Telephone Encounter (Signed)
Called pharmacy to make sure address was correct for patient as she needs refills and has moved. It was verified so I will submit refills.

## 2016-10-11 NOTE — Telephone Encounter (Signed)
Called patient insurance to verify correct address, submitted rx's.

## 2016-10-11 NOTE — Telephone Encounter (Signed)
Pt needs us to call in the toujeo and the novolog, she has relocated to [place]. [facility]. She is going to see an endo there but has not been able to find one right now.

## 2016-10-20 ENCOUNTER — Other Ambulatory Visit: Payer: Self-pay | Admitting: Family

## 2016-11-13 ENCOUNTER — Other Ambulatory Visit: Payer: Self-pay | Admitting: Emergency Medicine

## 2016-11-13 MED ORDER — FLUTICASONE PROPIONATE 50 MCG/ACT NA SUSP: NASAL | 1 refills | Status: DC

## 2017-01-03 ENCOUNTER — Telehealth: Payer: Self-pay | Admitting: *Deleted

## 2017-01-03 MED ORDER — AMLODIPINE BESYLATE 5 MG PO TABS: 5.0000 mg | ORAL_TABLET | Freq: Every day | ORAL | 0 refills | Status: DC

## 2017-01-03 NOTE — Telephone Encounter (Signed)
Requested drug refills are authorized 30-day Only, however, the patient needs further evaluation and/or laboratory testing before further refills are given.  Please call patient and ask her to make an appointment for CPE and/or F/U with fasting labs prior to any further refill authorizations per provider/SLS 05/17

## 2017-01-04 NOTE — Telephone Encounter (Signed)
Unable to LVM, phone just rang, will try again.

## 2017-01-05 DIAGNOSIS — N952 Postmenopausal atrophic vaginitis: Secondary | ICD-10-CM | POA: Insufficient documentation

## 2017-01-05 DIAGNOSIS — G43709 Chronic migraine without aura, not intractable, without status migrainosus: Secondary | ICD-10-CM | POA: Insufficient documentation

## 2017-05-08 ENCOUNTER — Other Ambulatory Visit: Payer: Self-pay

## 2017-05-08 MED ORDER — INSULIN ASPART 100 UNIT/ML FLEXPEN: 4.0000 [IU] | PEN_INJECTOR | Freq: Three times a day (TID) | SUBCUTANEOUS | 1 refills | Status: DC

## 2017-05-09 ENCOUNTER — Other Ambulatory Visit: Payer: Self-pay

## 2017-05-09 ENCOUNTER — Telehealth: Payer: Self-pay | Admitting: Internal Medicine

## 2017-05-09 MED ORDER — INSULIN LISPRO 100 UNIT/ML (KWIKPEN): PEN_INJECTOR | SUBCUTANEOUS | 1 refills | Status: DC

## 2017-05-09 NOTE — Telephone Encounter (Signed)
OK to change to Humalog pens.

## 2017-05-09 NOTE — Telephone Encounter (Signed)
Changed.

## 2017-05-09 NOTE — Telephone Encounter (Signed)
Pharmacy calling with alternatives to Novolog flexpen. It is not covered by insurance.   Humalog vial or humalog quik pen that are manufactured by [employer].   Reference# [id number]

## 2017-05-09 NOTE — Telephone Encounter (Signed)
Submitted RX 

## 2017-05-09 NOTE — Telephone Encounter (Signed)
Okay to switch?  

## 2017-05-10 ENCOUNTER — Other Ambulatory Visit: Payer: Self-pay

## 2017-05-10 MED ORDER — INSULIN ASPART 100 UNIT/ML FLEXPEN: 4.0000 [IU] | PEN_INJECTOR | Freq: Three times a day (TID) | SUBCUTANEOUS | 1 refills | Status: DC

## 2017-05-13 ENCOUNTER — Telehealth: Payer: Self-pay | Admitting: Internal Medicine

## 2017-05-13 ENCOUNTER — Other Ambulatory Visit: Payer: Self-pay

## 2017-05-13 NOTE — Telephone Encounter (Signed)
Already switched to Humalog pens.

## 2017-05-13 NOTE — Telephone Encounter (Signed)
[facility] in reference to patient's insurance not covering [facility]valog. [facility] stated other options are Humalog vile and Humalog kwikpen. Ref. number [id number]. Please call [facility] and advise.

## 2017-05-13 NOTE — Telephone Encounter (Signed)
Routing to you °

## 2017-05-14 ENCOUNTER — Other Ambulatory Visit: Payer: Self-pay

## 2017-05-14 NOTE — Telephone Encounter (Signed)
Called and gave verbal for the Humalog pen.

## 2017-05-14 NOTE — Telephone Encounter (Signed)
Wants to know if Humalog was supposed to go to [facility]?  ZOX#[id number]  Please call back.  Number listed for patient is a bad number.  Ty,  -LL

## 2017-05-15 ENCOUNTER — Other Ambulatory Visit: Payer: Self-pay

## 2017-05-15 MED ORDER — INSULIN LISPRO 100 UNIT/ML (KWIKPEN): PEN_INJECTOR | SUBCUTANEOUS | 1 refills | Status: DC

## 2017-06-19 ENCOUNTER — Other Ambulatory Visit: Payer: Self-pay | Admitting: Internal Medicine

## 2017-11-06 ENCOUNTER — Other Ambulatory Visit: Payer: Self-pay | Admitting: Family

## 2018-10-16 ENCOUNTER — Other Ambulatory Visit: Payer: Self-pay | Admitting: Internal Medicine

## 2019-07-08 DIAGNOSIS — S060X0A Concussion without loss of consciousness, initial encounter: Secondary | ICD-10-CM | POA: Insufficient documentation

## 2020-03-22 ENCOUNTER — Other Ambulatory Visit: Payer: Self-pay

## 2020-03-22 ENCOUNTER — Ambulatory Visit: Payer: BLUE CROSS/BLUE SHIELD | Admitting: Medical-Surgical

## 2020-03-22 ENCOUNTER — Telehealth: Payer: Self-pay

## 2020-03-22 ENCOUNTER — Encounter: Payer: Self-pay | Admitting: Medical-Surgical

## 2020-03-22 VITALS — BP 115/74 | HR 72 | Temp 97.3°F | Ht 59.25 in | Wt 172.8 lb

## 2020-03-22 DIAGNOSIS — Z1231 Encounter for screening mammogram for malignant neoplasm of breast: Secondary | ICD-10-CM

## 2020-03-22 DIAGNOSIS — M545 Low back pain, unspecified: Secondary | ICD-10-CM

## 2020-03-22 DIAGNOSIS — Z0000 Encounter for general adult medical examination without abnormal findings: Secondary | ICD-10-CM

## 2020-03-22 DIAGNOSIS — Z114 Encounter for screening for human immunodeficiency virus [HIV]: Secondary | ICD-10-CM

## 2020-03-22 DIAGNOSIS — Z7689 Persons encountering health services in other specified circumstances: Secondary | ICD-10-CM | POA: Diagnosis not present

## 2020-03-22 DIAGNOSIS — E119 Type 2 diabetes mellitus without complications: Secondary | ICD-10-CM | POA: Diagnosis not present

## 2020-03-22 DIAGNOSIS — G43909 Migraine, unspecified, not intractable, without status migrainosus: Secondary | ICD-10-CM

## 2020-03-22 DIAGNOSIS — I10 Essential (primary) hypertension: Secondary | ICD-10-CM

## 2020-03-22 DIAGNOSIS — K219 Gastro-esophageal reflux disease without esophagitis: Secondary | ICD-10-CM

## 2020-03-22 LAB — POCT GLYCOSYLATED HEMOGLOBIN (HGB A1C): Hemoglobin A1C: 7.4 % — AB (ref 4.0–5.6)

## 2020-03-22 LAB — POCT UA - MICROALBUMIN
Albumin/Creatinine Ratio, Urine, POC: <30
Creatinine, POC: 300 mg/dL
Microalbumin Ur, POC: 30 mg/L

## 2020-03-22 MED ORDER — AMLODIPINE BESYLATE 10 MG PO TABS: 10.0000 mg | ORAL_TABLET | Freq: Every day | ORAL | 3 refills | Status: DC

## 2020-03-22 MED ORDER — METOPROLOL SUCCINATE ER 50 MG PO TB24: 50.0000 mg | ORAL_TABLET | Freq: Every day | ORAL | 3 refills | Status: DC

## 2020-03-22 MED ORDER — INSULIN LISPRO 200 UNIT/ML ~~LOC~~ SOPN: 4.0000 [IU] | PEN_INJECTOR | Freq: Three times a day (TID) | SUBCUTANEOUS | 10 refills | Status: DC

## 2020-03-22 MED ORDER — MELOXICAM 15 MG PO TABS: 7.5000 mg | ORAL_TABLET | Freq: Every day | ORAL | 0 refills | Status: DC | PRN

## 2020-03-22 MED ORDER — TOUJEO SOLOSTAR 300 UNIT/ML ~~LOC~~ SOPN: 38.0000 [IU] | PEN_INJECTOR | Freq: Every morning | SUBCUTANEOUS | 3 refills | Status: DC

## 2020-03-22 NOTE — Telephone Encounter (Signed)
-----   Message from Joy Jessup, NP sent at 03/22/2020 10:58 AM EDT ----- Regarding: Please contact patient Please let her know I have placed an order to have her low back x-rays done. She can come by downstairs at her convenience and have those done. I have also sent Meloxicam to the [facility] to see if this helps with her back pain. Start with 1/2 tab daily as needed but can take up to 1 full tablet. Avoid all other NSAIDs (ibuprofen, Aleve, Motrin, etc.) while taking this medication. It is okay to take Tylenol as needed.  

## 2020-03-22 NOTE — Telephone Encounter (Signed)
LVMTRC (1st attempt)   

## 2020-03-22 NOTE — Progress Notes (Signed)
New Patient Office Visit  Subjective:  Patient ID: Kathryn Rogers, female    DOB: [birth year]  Age: 59 y.o. MRN: [id number]  CC:  Chief Complaint  Patient presents with  . Establish Care    HPI [name] presents to establish care and for annual physical exam.  Dentist- every 6 months, had a cavity fixed yesterday Eye exam- upcoming in October, wears reading glasses Diet- working on making healthy choices, does carb counting Exercise- walks her dog daily around the neighborhood, sometimes twice daily  HTN- taking Amlodipine 10mg , Losartan 25mg , and Toprol-XL 50mg  daily. Tolerating well without side effects.   DM- Taking Janumet twice daily, toujeo 38 units every morning, and insulin lispro 4-8 units TID with meals depending on carbohydrates consumed. Morning sugars average in 140s but she has had some recent highs in the 300s, unsure why.   GERD- taking pepcid AC daily, good management of symptoms.   Lumbar back pain- history of surgery on lumbar spine. Chronic lumbar back pain worse lately, especially with the move in the last few months. Taking a supplement ) that helps a little but not completely. No imaging in many years.  Migraine- well managed with Toprol daily and Imitrex as needed.  Past Medical History:  Diagnosis Date  . Diabetes mellitus without complication (HCC)   . Diverticulosis   . History of rheumatic fever    age 58  . Hyperlipidemia   . Hypertension   . Migraine     Past Surgical History:  Procedure Laterality Date  . ABDOMINAL HYSTERECTOMY  1991  . CESAREAN SECTION     x 2  . CHOLECYSTECTOMY  1993  . LUMBAR DISC SURGERY  2004   L5, discectomy and fusion    Family History  Problem Relation Age of Onset  . Hypertension Father   . Alcohol abuse Paternal Grandfather   . Cancer Cousin        breast  . Cancer Paternal Aunt        breast    Social History   Socioeconomic History  . Marital status: Married    Spouse name: Not on file   . Number of children: Not on file  . Years of education: Not on file  . Highest education level: Not on file  Occupational History  . Not on file  Tobacco Use  . Smoking status: Never Smoker  . Smokeless tobacco: Never Used  Vaping Use  . Vaping Use: Never used  Substance and Sexual Activity  . Alcohol use: No  . Drug use: No  . Sexual activity: Yes    Partners: Male    Birth control/protection: Surgical  Other Topics Concern  . Not on file  Social History Narrative   Moved from [place] 2016, originally from [facility] (parents/sisters in [place])   Homemaker   Completed 12th grade   2 daughter (6 grandchildren) one grandchild lives with her.   1 daughter in [place] and 1 in [place]   Enjoys spending time with her 25 year old grand-daughter   Previously worked in [place] [employer].   Social Determinants of Health   Financial Resource Strain:   . Difficulty of Paying Living Expenses:   Food Insecurity:   . Worried About [place] in the Last Year:   . 15 in the Last Year:   Transportation Needs:   . [employer] (Medical):   Programme researcher, broadcasting/film/video Lack of Transportation (Non-Medical):   Physical Activity:   . Days  of Exercise per Week:   . Minutes of Exercise per Session:   Stress:   . Feeling of Stress :   Social Connections:   . Frequency of Communication with Friends and Family:   . Frequency of Social Gatherings with Friends and Family:   . Attends Religious Services:   . Active Member of Clubs or Organizations:   . Attends Banker Meetings:   Marland Kitchen Marital Status:   Intimate Partner Violence:   . Fear of Current or Ex-Partner:   . Emotionally Abused:   Marland Kitchen Physically Abused:   . Sexually Abused:     ROS Review of Systems  Constitutional: Negative for chills, fatigue, fever and unexpected weight change.  HENT: Negative for ear pain.   Eyes: Negative for visual disturbance.  Respiratory: Negative for cough, chest tightness, shortness of breath and  wheezing.   Cardiovascular: Negative for chest pain, palpitations and leg swelling.  Gastrointestinal: Negative for abdominal pain, constipation, diarrhea, nausea and vomiting.  Endocrine: Negative for cold intolerance, heat intolerance, polydipsia, polyphagia and polyuria.  Genitourinary: Negative for dysuria, frequency and urgency.  Musculoskeletal: Positive for back pain. Negative for arthralgias and myalgias.  Skin: Positive for rash (scalp along the back of the hairline at the top of the neck).  Neurological: Positive for headaches (migraines intermittently with weather/food triggers). Negative for dizziness and numbness.  Psychiatric/Behavioral: Negative for dysphoric mood, self-injury, sleep disturbance and suicidal ideas. The patient is not nervous/anxious.     Objective:   Today's Vitals: BP 115/74   Pulse 72   Temp (!) 97.3 F (36.3 C) (Oral)   Ht 4' 11.25" (1.505 m)   Wt 172 lb 12.8 oz (78.4 kg)   SpO2 98%   BMI 34.61 kg/m   Physical Exam Vitals reviewed.  Constitutional:      General: She is not in acute distress.    Appearance: Normal appearance.  HENT:     Head: Normocephalic and atraumatic.     Right Ear: Tympanic membrane, ear canal and external ear normal. There is no impacted cerumen.     Left Ear: Tympanic membrane, ear canal and external ear normal. There is no impacted cerumen.     Nose: Nose normal.     Mouth/Throat:     Mouth: Mucous membranes are moist.     Pharynx: No oropharyngeal exudate or posterior oropharyngeal erythema.  Eyes:     General: No scleral icterus.       Right eye: No discharge.        Left eye: No discharge.     Extraocular Movements: Extraocular movements intact.     Conjunctiva/sclera: Conjunctivae normal.     Pupils: Pupils are equal, round, and reactive to light.  Neck:     Vascular: No carotid bruit.  Cardiovascular:     Rate and Rhythm: Normal rate and regular rhythm.     Pulses: Normal pulses.     Heart sounds: Normal  heart sounds. No murmur heard.  No friction rub. No gallop.   Pulmonary:     Effort: Pulmonary effort is normal. No respiratory distress.     Breath sounds: Normal breath sounds. No wheezing.  Abdominal:     General: Bowel sounds are normal. There is no distension.     Palpations: Abdomen is soft. There is no mass.     Tenderness: There is no abdominal tenderness. There is no guarding or rebound.     Hernia: No hernia is present.  Musculoskeletal:  General: Normal range of motion.     Cervical back: Normal range of motion and neck supple. No rigidity or tenderness.  Lymphadenopathy:     Cervical: No cervical adenopathy.  Skin:    General: Skin is warm and dry.  Neurological:     Mental Status: She is alert and oriented to person, place, and time.  Psychiatric:        Mood and Affect: Mood normal.        Behavior: Behavior normal.        Thought Content: Thought content normal.        Judgment: Judgment normal.     Assessment & Plan:  1. Encounter to establish care Reviewed available records. Requesting prior records from her PCP in [place]. Discussed health concerns with patient. Updating physical and labs today. Ordering mammogram.  2. Annual physical exam Checking CBC, CMP, and Lipid panel today. - CBC - COMPLETE METABOLIC PANEL WITH GFR - Lipid panel  3. Diabetes mellitus without complication (HCC) POCT HgbA1c 7.4% up from 5.5% per patient report. POCT UA Microalbumin and diabetic foot exam completed. Continue Janumet, Toujeo, and Novolog as ordered. Continue carb counting, dietary limitations, and exercise. - POCT glycosylated hemoglobin (Hb A1C) - POCT UA - Microalbumin  4. Essential hypertension Continue Losartan, Amlodipine, and Toprol-XL as prescribed. Continue low-sodium diet.   5. GERD Continue Pepcid AC daily. Advised to take away from other medications as this can hinder absorption of her regular meds and alter efficacy.  6. Lumbar back  pain Getting updated lumbar spine x-rays. Trial meloxicam 7.5-15mg  daily as needed to see if this helps with pain and tolerance of daily activities.  7. Migraine Continue Imitrex as needed.  8. Encounter for screening mammogram for malignant neoplasm of breast Mammogram ordered today. - MM 3D SCREEN BREAST BILATERAL; Future  9. Encounter for screening for HIV Discussed screening recommendations. Patient agreeable so adding to blood work today. - HIV Antibody (routine testing w rflx)  Outpatient Encounter Medications as of 03/22/2020  Medication Sig  . amLODipine (NORVASC) 10 MG tablet Take 1 tablet (10 mg total) by mouth daily.  Marland Kitchen amoxicillin (AMOXIL) 500 MG capsule Takes for dental procedures  . Ascorbic Acid (VITAMIN C PO) Take 1 tablet by mouth in the morning and at bedtime.  Marland Kitchen aspirin EC 81 MG tablet Take 81 mg by mouth daily.  Marland Kitchen CAREFINE PEN NEEDLES 32G X 4 MM MISC USE FOUR TIMES A DAY  . COD LIVER OIL PO Take 1 capsule by mouth daily.  Marland Kitchen ELDERBERRY PO Take 1 tablet by mouth daily.  . fluticasone (FLONASE) 50 MCG/ACT nasal spray USE ONE SPRAY(S) IN EACH NOSTRIL ONCE DAILY AS NEEDED  . glucose blood test strip Use as instructed to check blood sugar 4 times daily. (One Touch Ultra Blue Test)  . insulin lispro (HUMALOG KWIKPEN) 100 UNIT/ML KiwkPen Inject 4-8 units 3 times daily  . Lancets (ONETOUCH ULTRASOFT) lancets USE ONE LANCET TO CHECK GLUCOSE 4 TIMES DAILY  . metoprolol succinate (TOPROL-XL) 50 MG 24 hr tablet Take 1 tablet (50 mg total) by mouth daily.  . Multiple Vitamins-Minerals (ZINC PO) Take 1 tablet by mouth daily.  . ondansetron (ZOFRAN ODT) 4 MG disintegrating tablet Take 1 tablet (4 mg total) by mouth every 8 (eight) hours as needed for nausea or vomiting.  . sitaGLIPtin-metformin (JANUMET) 50-1000 MG tablet Take 1 tablet by mouth 2 (two) times daily with a meal.  . SUMAtriptan (IMITREX) 100 MG tablet TAKE 1 TABLET AS  NEEDED MAY REPEAT IN 2 HOURS IF HEADACHE PERSISTS OR  RECURS  . TOUJEO SOLOSTAR 300 UNIT/ML SOPN INJECT TWENTY-FIVE UNITS SUBCUTANEOUSLY ONCE DAILY BEFORE BREAKFAST  . VITAMIN D PO Take 1 tablet by mouth in the morning and at bedtime.  . [DISCONTINUED] amLODipine (NORVASC) 10 MG tablet Take 10 mg by mouth daily.  . [DISCONTINUED] amLODipine (NORVASC) 5 MG tablet Take 1 tablet (5 mg total) by mouth daily.  . [DISCONTINUED] metoprolol succinate (TOPROL-XL) 50 MG 24 hr tablet Take 50 mg by mouth daily.  . ferrous sulfate 325 (65 FE) MG tablet Take 1 tablet (325 mg total) by mouth 2 (two) times daily with a meal. (Patient not taking: Reported on 03/22/2020)  . Insulin Glargine (TOUJEO SOLOSTAR) 300 UNIT/ML SOPN Inject 30 Units into the skin daily before breakfast.  . losartan (COZAAR) 25 MG tablet Take 25 mg by mouth daily. (Patient not taking: Reported on 03/22/2020)  . meloxicam (MOBIC) 7.5 MG tablet Take 1 tablet (7.5 mg total) by mouth daily as needed for pain. (Patient not taking: Reported on 03/22/2020)  . metFORMIN (GLUCOPHAGE-XR) 500 MG 24 hr tablet Take 2 tablets (1,000 mg total) by mouth 2 (two) times daily with a meal. (Patient not taking: Reported on 03/22/2020)   No facility-administered encounter medications on file as of 03/22/2020.    Follow-up: Return in about 3 months (around 06/22/2020).   Christen ButterJoy Alegra Rost, NP

## 2020-03-23 LAB — COMPLETE METABOLIC PANEL WITH GFR
AG Ratio: 1.6 (calc) (ref 1.0–2.5)
ALT: 12 U/L (ref 6–29)
AST: 14 U/L (ref 10–35)
Albumin: 4.2 g/dL (ref 3.6–5.1)
Alkaline phosphatase (APISO): 47 U/L (ref 37–153)
BUN: 12 mg/dL (ref 7–25)
CO2: 29 mmol/L (ref 20–32)
Calcium: 9.8 mg/dL (ref 8.6–10.4)
Chloride: 104 mmol/L (ref 98–110)
Creat: 0.88 mg/dL (ref 0.50–1.05)
GFR, Est African American: 84 mL/min/{1.73_m2} (ref >=60)
GFR, Est Non African American: 72 mL/min/{1.73_m2} (ref >=60)
Globulin: 2.7 g/dL (calc) (ref 1.9–3.7)
Glucose, Bld: 65 mg/dL (ref 65–99)
Potassium: 4.6 mmol/L (ref 3.5–5.3)
Sodium: 141 mmol/L (ref 135–146)
Total Bilirubin: 0.2 mg/dL (ref 0.2–1.2)
Total Protein: 6.9 g/dL (ref 6.1–8.1)

## 2020-03-23 LAB — LIPID PANEL
Cholesterol: 152 mg/dL (ref <200)
HDL: 47 mg/dL — ABNORMAL LOW (ref >=50)
LDL Cholesterol (Calc): 78 mg/dL (calc)
Non-HDL Cholesterol (Calc): 105 mg/dL (calc) (ref <130)
Total CHOL/HDL Ratio: 3.2 (calc) (ref <5.0)
Triglycerides: 174 mg/dL — ABNORMAL HIGH (ref <150)

## 2020-03-23 LAB — CBC
HCT: 35.6 % (ref 35.0–45.0)
Hemoglobin: 11 g/dL — ABNORMAL LOW (ref 11.7–15.5)
MCH: 23.6 pg — ABNORMAL LOW (ref 27.0–33.0)
MCHC: 30.9 g/dL — ABNORMAL LOW (ref 32.0–36.0)
MCV: 76.4 fL — ABNORMAL LOW (ref 80.0–100.0)
MPV: 10.1 fL (ref 7.5–12.5)
Platelets: 408 10*3/uL — ABNORMAL HIGH (ref 140–400)
RBC: 4.66 10*6/uL (ref 3.80–5.10)
RDW: 15 % (ref 11.0–15.0)
WBC: 7.6 10*3/uL (ref 3.8–10.8)

## 2020-03-23 LAB — HIV ANTIBODY (ROUTINE TESTING W REFLEX): HIV 1&2 Ab, 4th Generation: NONREACTIVE

## 2020-03-23 NOTE — Telephone Encounter (Signed)
Pt aware of Joy's recommendations and orders. Pt is now on the MyChart patient portal so I sent a copy of what Joy said to her in a MyChart message. No further questions or concerns at this time.

## 2020-03-24 ENCOUNTER — Telehealth: Payer: Self-pay

## 2020-03-24 ENCOUNTER — Ambulatory Visit (INDEPENDENT_AMBULATORY_CARE_PROVIDER_SITE_OTHER): Payer: BC Managed Care – PPO

## 2020-03-24 ENCOUNTER — Other Ambulatory Visit: Payer: Self-pay

## 2020-03-24 DIAGNOSIS — M545 Low back pain, unspecified: Secondary | ICD-10-CM

## 2020-03-24 NOTE — Telephone Encounter (Signed)
Pt aware of Joy's message below.

## 2020-03-24 NOTE — Telephone Encounter (Signed)
-----   Message from Christen Butter, NP sent at 03/22/2020 10:58 AM EDT ----- Regarding: Please contact patient Please let her know I have placed an order to have her low back x-rays done. She can come by downstairs at her convenience and have those done. I have also sent Meloxicam to the [facility] pharmacy to see if this helps with her back pain. Start with 1/2 tab daily as needed but can take up to 1 full tablet. Avoid all other NSAIDs (ibuprofen, Aleve, Motrin, etc.) while taking this medication. It is okay to take Tylenol as needed.

## 2020-03-31 ENCOUNTER — Ambulatory Visit (INDEPENDENT_AMBULATORY_CARE_PROVIDER_SITE_OTHER): Payer: BC Managed Care – PPO

## 2020-03-31 ENCOUNTER — Other Ambulatory Visit: Payer: Self-pay

## 2020-03-31 DIAGNOSIS — Z1231 Encounter for screening mammogram for malignant neoplasm of breast: Secondary | ICD-10-CM | POA: Diagnosis not present

## 2020-04-04 ENCOUNTER — Encounter: Payer: Self-pay | Admitting: Medical-Surgical

## 2020-04-04 ENCOUNTER — Other Ambulatory Visit: Payer: Self-pay | Admitting: Family

## 2020-04-04 MED ORDER — ONDANSETRON 4 MG PO TBDP: 4.0000 mg | ORAL_TABLET | Freq: Three times a day (TID) | ORAL | 0 refills | Status: DC | PRN

## 2020-04-29 ENCOUNTER — Other Ambulatory Visit: Payer: Self-pay | Admitting: Medical-Surgical

## 2020-05-02 ENCOUNTER — Encounter: Payer: Self-pay | Admitting: Medical-Surgical

## 2020-05-02 MED ORDER — MELOXICAM 15 MG PO TABS: 7.5000 mg | ORAL_TABLET | Freq: Every day | ORAL | 0 refills | Status: DC | PRN

## 2020-05-03 ENCOUNTER — Other Ambulatory Visit: Payer: Self-pay

## 2020-05-03 MED ORDER — JANUMET 50-1000 MG PO TABS: 1.0000 | ORAL_TABLET | Freq: Two times a day (BID) | ORAL | 3 refills | Status: DC

## 2020-05-23 ENCOUNTER — Encounter: Payer: Self-pay | Admitting: Medical-Surgical

## 2020-05-25 NOTE — Telephone Encounter (Signed)
I called and spoke with the pharmacy and they sent out 2 pens which according to their calculations based on the sig, will be enough to last her a little over 90 days.

## 2020-06-01 ENCOUNTER — Other Ambulatory Visit: Payer: Self-pay | Admitting: Medical-Surgical

## 2020-06-01 DIAGNOSIS — E119 Type 2 diabetes mellitus without complications: Secondary | ICD-10-CM

## 2020-06-01 MED ORDER — INSULIN LISPRO (1 UNIT DIAL) 100 UNIT/ML (KWIKPEN): 4.0000 [IU] | PEN_INJECTOR | Freq: Three times a day (TID) | SUBCUTANEOUS | 11 refills | Status: DC

## 2020-07-24 ENCOUNTER — Other Ambulatory Visit: Payer: Self-pay | Admitting: Medical-Surgical

## 2020-07-29 ENCOUNTER — Other Ambulatory Visit: Payer: Self-pay | Admitting: Medical-Surgical

## 2020-08-01 MED ORDER — ONDANSETRON 4 MG PO TBDP: 4.0000 mg | ORAL_TABLET | Freq: Three times a day (TID) | ORAL | 0 refills | Status: DC | PRN

## 2020-09-16 ENCOUNTER — Encounter: Payer: Self-pay | Admitting: Medical-Surgical

## 2020-09-16 DIAGNOSIS — H43393 Other vitreous opacities, bilateral: Secondary | ICD-10-CM | POA: Diagnosis not present

## 2020-09-16 DIAGNOSIS — E109 Type 1 diabetes mellitus without complications: Secondary | ICD-10-CM | POA: Diagnosis not present

## 2020-09-16 DIAGNOSIS — Z794 Long term (current) use of insulin: Secondary | ICD-10-CM | POA: Diagnosis not present

## 2020-09-16 DIAGNOSIS — H25013 Cortical age-related cataract, bilateral: Secondary | ICD-10-CM | POA: Diagnosis not present

## 2020-09-16 LAB — HM DIABETES EYE EXAM

## 2020-09-19 ENCOUNTER — Other Ambulatory Visit: Payer: Self-pay

## 2020-09-19 MED ORDER — BD PEN NEEDLE SHORT U/F 31G X 8 MM MISC: 1.0000 | Freq: Four times a day (QID) | 0 refills | Status: DC

## 2020-10-11 ENCOUNTER — Encounter: Payer: BC Managed Care – PPO | Admitting: Medical-Surgical

## 2020-10-25 ENCOUNTER — Ambulatory Visit (INDEPENDENT_AMBULATORY_CARE_PROVIDER_SITE_OTHER): Payer: BC Managed Care – PPO

## 2020-10-25 ENCOUNTER — Other Ambulatory Visit: Payer: Self-pay

## 2020-10-25 ENCOUNTER — Ambulatory Visit: Payer: BC Managed Care – PPO | Admitting: Medical-Surgical

## 2020-10-25 ENCOUNTER — Encounter: Payer: Self-pay | Admitting: Medical-Surgical

## 2020-10-25 VITALS — BP 137/70 | HR 85 | Temp 98.6°F | Ht 59.0 in | Wt 173.2 lb

## 2020-10-25 DIAGNOSIS — D509 Iron deficiency anemia, unspecified: Secondary | ICD-10-CM

## 2020-10-25 DIAGNOSIS — E1169 Type 2 diabetes mellitus with other specified complication: Secondary | ICD-10-CM

## 2020-10-25 DIAGNOSIS — R918 Other nonspecific abnormal finding of lung field: Secondary | ICD-10-CM | POA: Diagnosis not present

## 2020-10-25 DIAGNOSIS — G43909 Migraine, unspecified, not intractable, without status migrainosus: Secondary | ICD-10-CM

## 2020-10-25 DIAGNOSIS — J984 Other disorders of lung: Secondary | ICD-10-CM

## 2020-10-25 DIAGNOSIS — E119 Type 2 diabetes mellitus without complications: Secondary | ICD-10-CM | POA: Diagnosis not present

## 2020-10-25 DIAGNOSIS — I8393 Asymptomatic varicose veins of bilateral lower extremities: Secondary | ICD-10-CM | POA: Insufficient documentation

## 2020-10-25 DIAGNOSIS — E1065 Type 1 diabetes mellitus with hyperglycemia: Secondary | ICD-10-CM

## 2020-10-25 DIAGNOSIS — I159 Secondary hypertension, unspecified: Secondary | ICD-10-CM

## 2020-10-25 DIAGNOSIS — K219 Gastro-esophageal reflux disease without esophagitis: Secondary | ICD-10-CM

## 2020-10-25 DIAGNOSIS — E785 Hyperlipidemia, unspecified: Secondary | ICD-10-CM

## 2020-10-25 DIAGNOSIS — Z0000 Encounter for general adult medical examination without abnormal findings: Secondary | ICD-10-CM

## 2020-10-25 LAB — POCT GLYCOSYLATED HEMOGLOBIN (HGB A1C): Hemoglobin A1C: 7.4 % — AB (ref 4.0–5.6)

## 2020-10-25 MED ORDER — ONDANSETRON 4 MG PO TBDP: 4.0000 mg | ORAL_TABLET | Freq: Three times a day (TID) | ORAL | 2 refills | Status: DC | PRN

## 2020-10-25 MED ORDER — LOSARTAN POTASSIUM 25 MG PO TABS: 25.0000 mg | ORAL_TABLET | Freq: Every day | ORAL | 3 refills | Status: DC

## 2020-10-25 MED ORDER — BLOOD GLUCOSE MONITOR KIT: PACK | 0 refills | Status: AC

## 2020-10-25 MED ORDER — SUMATRIPTAN SUCCINATE 100 MG PO TABS: ORAL_TABLET | ORAL | 0 refills | Status: DC

## 2020-10-25 MED ORDER — TOUJEO SOLOSTAR 300 UNIT/ML ~~LOC~~ SOPN: 38.0000 [IU] | PEN_INJECTOR | Freq: Every morning | SUBCUTANEOUS | 3 refills | Status: DC

## 2020-10-25 MED ORDER — JANUMET 50-1000 MG PO TABS: 1.0000 | ORAL_TABLET | Freq: Two times a day (BID) | ORAL | 3 refills | Status: DC

## 2020-10-25 MED ORDER — AMLODIPINE BESYLATE 10 MG PO TABS: 10.0000 mg | ORAL_TABLET | Freq: Every day | ORAL | 3 refills | Status: DC

## 2020-10-25 MED ORDER — BD PEN NEEDLE SHORT U/F 31G X 8 MM MISC: 1.0000 | Freq: Four times a day (QID) | 0 refills | Status: DC

## 2020-10-25 MED ORDER — METOPROLOL SUCCINATE ER 50 MG PO TB24: 50.0000 mg | ORAL_TABLET | Freq: Every day | ORAL | 3 refills | Status: DC

## 2020-10-25 MED ORDER — MELOXICAM 15 MG PO TABS: 7.5000 mg | ORAL_TABLET | Freq: Every day | ORAL | 0 refills | Status: DC | PRN

## 2020-10-25 MED ORDER — INSULIN LISPRO (1 UNIT DIAL) 100 UNIT/ML (KWIKPEN)
4.0000 [IU] | PEN_INJECTOR | Freq: Three times a day (TID) | SUBCUTANEOUS | 11 refills | Status: DC
Start: 2020-10-25 — End: 2021-12-27

## 2020-10-25 MED ORDER — FLUTICASONE PROPIONATE 50 MCG/ACT NA SUSP: NASAL | 1 refills | Status: DC

## 2020-10-25 NOTE — Progress Notes (Signed)
HPI: Doretta Remmert is a 60 y.o. female who  has a past medical history of Diabetes mellitus without complication ([place]), Diverticulosis, History of rheumatic fever, Hyperlipidemia, Hypertension, and Migraine.  she presents to [facility] today, 10/25/20,  for chief complaint of: Annual physical exam  Dentist: UTD, no concerns Eye exam: UTD, readers Exercise: daily walking Diet: mild lactose intolerance Pap smear: TAH, not due Mammogram: UTD Colon cancer screening: done in [place], 2014, normal COVID vaccine: Done, + booster  Concerns: Lung lesion found when in [place], supposed to follow up every year to monitor. Unsure what the lesion was. Due for yearly eval now.   DM-has had several hypoglycemic episodes as low as the 20s and notes that her morning readings have been higher.  She has not checked her sugars in the middle of the night so is not sure if she is dropping well while sleeping.  She does avoid sodas and sweet tea.  She is using carb counting and dosing with insulin lispro with meals.  Takes 39 units of Toujeo daily every morning.  Also taking Janumet as prescribed.  Hypertension-blood pressures are doing okay.  She is taking amlodipine, losartan, and Toprol-XL as prescribed, tolerating well without side effects.  GERD-taking as needed Pepcid AC and Prilosec if she has any flares.  Migraines-she has had increased frequency of migraines over the past couple of weeks but is not sure why.  Endorses approximately 6 migraine days per month.  Uses Imitrex as needed which is very helpful for migraine abortion.  Past medical, surgical, social and family history reviewed:  Patient Active Problem List   Diagnosis Date Noted  . Varicose veins of both lower extremities 10/25/2020  . Diabetes mellitus without complication ([place]) 28/31/5176  . Lumbar back pain 03/22/2020  . Gastroesophageal reflux disease without esophagitis 03/22/2020  . Preventative  health care 11/14/2015  . Type 1 diabetes mellitus with hyperglycemia ([place]) 04/28/2015  . Fever 04/01/2015  . Anemia, iron deficiency 04/01/2015  . HTN (hypertension) 10/11/2014  . Migraine 10/11/2014  . History of rheumatic fever 10/11/2014    Past Surgical History:  Procedure Laterality Date  . ABDOMINAL HYSTERECTOMY  1991  . CESAREAN SECTION     x 2  . CHOLECYSTECTOMY  1993  . LUMBAR [place] SURGERY  2004   L5, discectomy and fusion    Social History   Tobacco Use  . Smoking status: Never Smoker  . Smokeless tobacco: Never Used  Substance Use Topics  . Alcohol use: No    Family History  Problem Relation Age of Onset  . Hypertension Father   . Alcohol abuse Paternal Grandfather   . Cancer Cousin        breast  . Cancer Paternal Aunt        breast     Current medication list and allergy/intolerance information reviewed:    Current Outpatient Medications  Medication Sig Dispense Refill  . blood glucose meter kit and supplies KIT Dispense based on patient and insurance preference. Use up to four times daily as directed. Please include lancets, test strips, control solution. 1 each 0  . amLODipine (NORVASC) 10 MG tablet Take 1 tablet (10 mg total) by mouth daily. 90 tablet 3  . amoxicillin (AMOXIL) 500 MG capsule Takes for dental procedures  5  . Ascorbic Acid (VITAMIN C PO) Take 1 tablet by mouth in the morning and at bedtime.    Marland Kitchen aspirin EC 81 MG tablet Take 81 mg by  mouth daily.    . B-D ULTRAFINE III SHORT PEN 31G X 8 MM MISC Inject 1 Device into the skin QID. 130 each 0  . COD LIVER OIL PO Take 1 capsule by mouth daily.    Marland Kitchen ELDERBERRY PO Take 1 tablet by mouth daily.    . fluticasone (FLONASE) 50 MCG/ACT nasal spray USE ONE SPRAY(S) IN EACH NOSTRIL ONCE DAILY AS NEEDED 48 g 1  . glucose blood test strip Use as instructed to check blood sugar 4 times daily. (One Touch Ultra Blue Test) 135 each 5  . insulin glargine, 1 Unit Dial, (TOUJEO SOLOSTAR) 300 UNIT/ML  Solostar Pen Inject 38 Units into the skin in the morning. 6 mL 3  . insulin lispro (HUMALOG KWIKPEN) 100 UNIT/ML KwikPen Inject 4-8 Units into the skin 3 (three) times daily. 15 mL 11  . Lancets (ONETOUCH ULTRASOFT) lancets USE ONE LANCET TO CHECK GLUCOSE 4 TIMES DAILY 400 each 1  . losartan (COZAAR) 25 MG tablet Take 1 tablet (25 mg total) by mouth daily. 90 tablet 3  . meloxicam (MOBIC) 15 MG tablet Take 0.5-1 tablets (7.5-15 mg total) by mouth daily as needed for pain. 30 tablet 0  . metoprolol succinate (TOPROL-XL) 50 MG 24 hr tablet Take 1 tablet (50 mg total) by mouth daily. 90 tablet 3  . Multiple Vitamins-Minerals (ZINC PO) Take 1 tablet by mouth daily.    . ondansetron (ZOFRAN ODT) 4 MG disintegrating tablet Take 1 tablet (4 mg total) by mouth every 8 (eight) hours as needed for nausea or vomiting. 20 tablet 2  . sitaGLIPtin-metformin (JANUMET) 50-1000 MG tablet Take 1 tablet by mouth 2 (two) times daily with a meal. 180 tablet 3  . SUMAtriptan (IMITREX) 100 MG tablet TAKE 1 TABLET AS NEEDED MAY REPEAT IN 2 HOURS IF HEADACHE PERSISTS OR RECURS 27 tablet 0  . VITAMIN D PO Take 1 tablet by mouth in the morning and at bedtime.     No current facility-administered medications for this visit.    Allergies  Allergen Reactions  . Codeine Other (See Comments)    Dizziness, n/v, head itches  . Metoprolol Tartrate Other (See Comments)    Dull Headache      Review of Systems:  Constitutional:  No  fever, no chills, No recent illness, No unintentional weight changes. No significant fatigue.   HEENT: + headache, no vision change, no hearing change, No sore throat, No  sinus pressure  Cardiac: No  chest pain, No  pressure, No palpitations, No  Orthopnea  Respiratory:  No  shortness of breath. No  Cough  Gastrointestinal: No  abdominal pain, No  nausea, No  vomiting,  No  blood in stool, No  diarrhea, No  constipation   Musculoskeletal: + anterior right shin tenderness   Skin: No   Rash, No other wounds/concerning lesions  Genitourinary: No  incontinence, No  abnormal genital bleeding, No abnormal genital discharge  Hem/Onc: No  easy bruising/bleeding, No  abnormal lymph node  Endocrine: No cold intolerance,  No heat intolerance. No polyuria/polydipsia/polyphagia   Neurologic: No  weakness, No  dizziness, No  slurred speech/focal weakness/facial droop  Psychiatric: No  concerns with depression, No  concerns with anxiety, No sleep problems, No mood problems  Exam:  BP 137/70   Pulse 85   Temp 98.6 F (37 C)   Ht 4' 11"  (1.499 m)   Wt 173 lb 3.2 oz (78.6 kg)   SpO2 96%   BMI 34.98 kg/m  Constitutional: VS see above. General Appearance: alert, well-developed, well-nourished, NAD  Eyes: Normal lids and conjunctive, non-icteric sclera  Ears, Nose, Mouth, Throat: MMM, Normal external inspection ears/nares/mouth/lips/gums. TM normal bilaterally.    Neck: No masses, trachea midline. No thyroid enlargement. No tenderness/mass appreciated. No lymphadenopathy  Respiratory: Normal respiratory effort. no wheeze, no rhonchi, no rales  Cardiovascular: S1/S2 normal, no murmur, no rub/gallop auscultated. RRR. No lower extremity edema. Pedal pulse II/IV bilaterally PT. No carotid bruit or JVD. No abdominal aortic bruit. Varicose veins of the RLE.   Gastrointestinal: Nontender, no masses. No hepatomegaly, no splenomegaly. No hernia appreciated. Bowel sounds normal. Rectal exam deferred.   Musculoskeletal: Gait normal. No clubbing/cyanosis of digits.   Neurological: Normal balance/coordination. No tremor. No cranial nerve deficit on limited exam. Motor and sensation intact and symmetric. Cerebellar reflexes intact.   Skin: warm, dry, intact. No rash/ulcer. No concerning nevi or subq nodules on limited exam.    Psychiatric: Normal judgment/insight. Normal mood and affect. Oriented x3.    Results for orders placed or performed in visit on 10/25/20 (from the past 72  hour(s))  POCT glycosylated hemoglobin (Hb A1C)     Status: Abnormal   Collection Time: 10/25/20  8:28 AM  Result Value Ref Range   Hemoglobin A1C 7.4 (A) 4.0 - 5.6 %   HbA1c POC (<> result, manual entry)     HbA1c, POC (prediabetic range)     HbA1c, POC (controlled diabetic range)      No results found.   ASSESSMENT/PLAN:   1. Annual physical exam Checking CBC, CMP, and lipid panel today.  Up-to-date on other preventative care at this time.  2. Type 1 diabetes mellitus with hyperglycemia ([place]) Checking labs as below.  Point-of-care hemoglobin A1c 7.4%, same as last check 6 months ago.  Continue current regimen of Toujeo with carb counting and Janumet.  Recommend checking sugars early a.m. hours to check for Symogi effect on AM sugars.  May need to adjust p.m. insulin dosing. - COMPLETE METABOLIC PANEL WITH GFR - CBC with Differential/Platelet - Lipid panel - POCT glycosylated hemoglobin (Hb A1C) - insulin glargine, 1 Unit Dial, (TOUJEO SOLOSTAR) 300 UNIT/ML Solostar Pen; Inject 38 Units into the skin in the morning.  Dispense: 6 mL; Refill: 3  3. Secondary hypertension Checking labs.  Continue amlodipine, losartan, and Toprol-XL as prescribed. - COMPLETE METABOLIC PANEL WITH GFR - CBC with Differential/Platelet - Lipid panel  4. Iron deficiency anemia, unspecified iron deficiency anemia type Checking CBC and iron panel. - CBC with Differential/Platelet - Iron, TIBC and Ferritin Panel  5. Gastroesophageal reflux disease without esophagitis Continue as needed use of Prilosec and Pepcid AC.  6. Migraine without status migrainosus, not intractable, unspecified migraine type Continue sumatriptan as needed.  7. Scarring of lung Unsure of previous diagnosis.  Getting chest x-ray today. - DG Chest 2 View; Future  8. Varicose veins of both lower extremities, unspecified whether complicated Recommend compression stockings.  If pain recurs, use warm compresses several times  daily.  Discussed symptoms that should be evaluated further.  Patient verbalized understanding.  Orders Placed This Encounter  Procedures  . DG Chest 2 View  . COMPLETE METABOLIC PANEL WITH GFR  . CBC with Differential/Platelet  . Lipid panel  . TSH  . Iron, TIBC and Ferritin Panel  . POCT glycosylated hemoglobin (Hb A1C)    Meds ordered this encounter  Medications  . amLODipine (NORVASC) 10 MG tablet    Sig: Take 1 tablet (10 mg total)  by mouth daily.    Dispense:  90 tablet    Refill:  3    Order Specific Question:   Supervising Provider    Answer:   Emeterio Reeve [id number]  . B-D ULTRAFINE III SHORT PEN 31G X 8 MM MISC    Sig: Inject 1 Device into the skin QID.    Dispense:  130 each    Refill:  0    Order Specific Question:   Supervising Provider    Answer:   Emeterio Reeve [id number]  . meloxicam (MOBIC) 15 MG tablet    Sig: Take 0.5-1 tablets (7.5-15 mg total) by mouth daily as needed for pain.    Dispense:  30 tablet    Refill:  0    Order Specific Question:   Supervising Provider    Answer:   Emeterio Reeve [id number]  . metoprolol succinate (TOPROL-XL) 50 MG 24 hr tablet    Sig: Take 1 tablet (50 mg total) by mouth daily.    Dispense:  90 tablet    Refill:  3    Order Specific Question:   Supervising Provider    Answer:   Emeterio Reeve [id number]  . losartan (COZAAR) 25 MG tablet    Sig: Take 1 tablet (25 mg total) by mouth daily.    Dispense:  90 tablet    Refill:  3    Order Specific Question:   Supervising Provider    Answer:   Emeterio Reeve [id number]  . insulin lispro (HUMALOG KWIKPEN) 100 UNIT/ML KwikPen    Sig: Inject 4-8 Units into the skin 3 (three) times daily.    Dispense:  15 mL    Refill:  11    Order Specific Question:   Supervising Provider    Answer:   Emeterio Reeve [[id number]]  . insulin glargine, 1 Unit Dial, (TOUJEO SOLOSTAR) 300 UNIT/ML Solostar Pen    Sig: Inject 38 Units into the skin in the morning.     Dispense:  6 mL    Refill:  3    Please consider 90 day supplies to promote better adherence    Order Specific Question:   Supervising Provider    Answer:   Emeterio Reeve [[id number]]  . blood glucose meter kit and supplies KIT    Sig: Dispense based on patient and insurance preference. Use up to four times daily as directed. Please include lancets, test strips, control solution.    Dispense:  1 each    Refill:  0    Order Specific Question:   Supervising Provider    Answer:   Emeterio Reeve [[id number]]    Order Specific Question:   Number of strips    Answer:   200    Order Specific Question:   Number of lancets    Answer:   200  . fluticasone (FLONASE) 50 MCG/ACT nasal spray    Sig: USE ONE SPRAY(S) IN EACH NOSTRIL ONCE DAILY AS NEEDED    Dispense:  48 g    Refill:  1    Please consider 90 day supplies to promote better adherence    Order Specific Question:   Supervising Provider    Answer:   Emeterio Reeve [[id number]]  . ondansetron (ZOFRAN ODT) 4 MG disintegrating tablet    Sig: Take 1 tablet (4 mg total) by mouth every 8 (eight) hours as needed for nausea or vomiting.    Dispense:  20 tablet    Refill:  2    Order Specific Question:  Supervising Provider    Answer:   Emeterio Reeve [[id number]]  . sitaGLIPtin-metformin (JANUMET) 50-1000 MG tablet    Sig: Take 1 tablet by mouth 2 (two) times daily with a meal.    Dispense:  180 tablet    Refill:  3    Order Specific Question:   Supervising Provider    Answer:   Emeterio Reeve [[id number]]  . SUMAtriptan (IMITREX) 100 MG tablet    Sig: TAKE 1 TABLET AS NEEDED MAY REPEAT IN 2 HOURS IF HEADACHE PERSISTS OR RECURS    Dispense:  27 tablet    Refill:  0    Order Specific Question:   Supervising Provider    Answer:   Emeterio Reeve [[id number]]    There are no Patient Instructions on file for this visit.  Follow-up plan: Return in about 3 months (around 01/25/2021) for DM follow up.  Clearnce Sorrel, DNP, APRN,  FNP-BC [facility]

## 2020-10-26 LAB — COMPLETE METABOLIC PANEL WITH GFR
AG Ratio: 1.6 (calc) (ref 1.0–2.5)
ALT: 16 U/L (ref 6–29)
AST: 15 U/L (ref 10–35)
Albumin: 4.5 g/dL (ref 3.6–5.1)
Alkaline phosphatase (APISO): 58 U/L (ref 37–153)
BUN: 22 mg/dL (ref 7–25)
CO2: 26 mmol/L (ref 20–32)
Calcium: 10.2 mg/dL (ref 8.6–10.4)
Chloride: 102 mmol/L (ref 98–110)
Creat: 0.72 mg/dL (ref 0.50–1.05)
GFR, Est African American: 107 mL/min/{1.73_m2} (ref >=60)
GFR, Est Non African American: 92 mL/min/{1.73_m2} (ref >=60)
Globulin: 2.8 g/dL (calc) (ref 1.9–3.7)
Glucose, Bld: 123 mg/dL — ABNORMAL HIGH (ref 65–99)
Potassium: 4.7 mmol/L (ref 3.5–5.3)
Sodium: 140 mmol/L (ref 135–146)
Total Bilirubin: 0.2 mg/dL (ref 0.2–1.2)
Total Protein: 7.3 g/dL (ref 6.1–8.1)

## 2020-10-26 LAB — IRON,TIBC AND FERRITIN PANEL
%SAT: 5 % (calc) — ABNORMAL LOW (ref 16–45)
Ferritin: 3 ng/mL — ABNORMAL LOW (ref 16–232)
Iron: 21 ug/dL — ABNORMAL LOW (ref 45–160)
TIBC: 406 mcg/dL (calc) (ref 250–450)

## 2020-10-26 LAB — LIPID PANEL
Cholesterol: 189 mg/dL (ref <200)
HDL: 49 mg/dL — ABNORMAL LOW (ref >=50)
LDL Cholesterol (Calc): 106 mg/dL (calc) — ABNORMAL HIGH
Non-HDL Cholesterol (Calc): 140 mg/dL (calc) — ABNORMAL HIGH (ref <130)
Total CHOL/HDL Ratio: 3.9 (calc) (ref <5.0)
Triglycerides: 219 mg/dL — ABNORMAL HIGH (ref <150)

## 2020-10-26 LAB — CBC WITH DIFFERENTIAL/PLATELET
Absolute Monocytes: 439 cells/uL (ref 200–950)
Basophils Absolute: 52 cells/uL (ref 0–200)
Basophils Relative: 0.6 %
Eosinophils Absolute: 525 cells/uL — ABNORMAL HIGH (ref 15–500)
Eosinophils Relative: 6.1 %
HCT: 35.8 % (ref 35.0–45.0)
Hemoglobin: 11.1 g/dL — ABNORMAL LOW (ref 11.7–15.5)
Lymphs Abs: 1978 cells/uL (ref 850–3900)
MCH: 23.2 pg — ABNORMAL LOW (ref 27.0–33.0)
MCHC: 31 g/dL — ABNORMAL LOW (ref 32.0–36.0)
MCV: 74.9 fL — ABNORMAL LOW (ref 80.0–100.0)
MPV: 10.5 fL (ref 7.5–12.5)
Monocytes Relative: 5.1 %
Neutro Abs: 5607 cells/uL (ref 1500–7800)
Neutrophils Relative %: 65.2 %
Platelets: 402 10*3/uL — ABNORMAL HIGH (ref 140–400)
RBC: 4.78 10*6/uL (ref 3.80–5.10)
RDW: 15 % (ref 11.0–15.0)
Total Lymphocyte: 23 %
WBC: 8.6 10*3/uL (ref 3.8–10.8)

## 2020-10-26 LAB — TSH: TSH: 1.42 mIU/L (ref 0.40–4.50)

## 2020-10-26 MED ORDER — ATORVASTATIN CALCIUM 20 MG PO TABS: 20.0000 mg | ORAL_TABLET | Freq: Every day | ORAL | 3 refills | Status: DC

## 2020-10-26 MED ORDER — FERROUS SULFATE 325 (65 FE) MG PO TBEC: 325.0000 mg | DELAYED_RELEASE_TABLET | Freq: Every day | ORAL | 3 refills | Status: AC

## 2020-10-26 NOTE — Addendum Note (Signed)
Addended byChristen Butter on: 10/26/2020 09:24 AM   Modules accepted: Orders

## 2020-10-29 ENCOUNTER — Other Ambulatory Visit: Payer: Self-pay | Admitting: Medical-Surgical

## 2020-10-31 ENCOUNTER — Other Ambulatory Visit: Payer: Self-pay | Admitting: Medical-Surgical

## 2020-10-31 ENCOUNTER — Other Ambulatory Visit: Payer: Self-pay

## 2020-10-31 DIAGNOSIS — E119 Type 2 diabetes mellitus without complications: Secondary | ICD-10-CM

## 2020-10-31 MED ORDER — TOUJEO SOLOSTAR 300 UNIT/ML ~~LOC~~ SOPN: 38.0000 [IU] | PEN_INJECTOR | Freq: Every morning | SUBCUTANEOUS | 3 refills | Status: DC

## 2020-10-31 MED ORDER — BD PEN NEEDLE SHORT U/F 31G X 8 MM MISC: 1.0000 | Freq: Four times a day (QID) | 3 refills | Status: DC

## 2020-12-06 ENCOUNTER — Other Ambulatory Visit: Payer: Self-pay | Admitting: Medical-Surgical

## 2021-01-25 ENCOUNTER — Encounter: Payer: Self-pay | Admitting: Medical-Surgical

## 2021-01-25 ENCOUNTER — Ambulatory Visit: Payer: BC Managed Care – PPO | Admitting: Medical-Surgical

## 2021-01-25 ENCOUNTER — Other Ambulatory Visit: Payer: Self-pay

## 2021-01-25 VITALS — BP 121/74 | HR 72 | Temp 97.8°F | Ht 59.0 in | Wt 178.5 lb

## 2021-01-25 DIAGNOSIS — Z23 Encounter for immunization: Secondary | ICD-10-CM | POA: Diagnosis not present

## 2021-01-25 DIAGNOSIS — E1065 Type 1 diabetes mellitus with hyperglycemia: Secondary | ICD-10-CM

## 2021-01-25 DIAGNOSIS — E1169 Type 2 diabetes mellitus with other specified complication: Secondary | ICD-10-CM | POA: Diagnosis not present

## 2021-01-25 DIAGNOSIS — I10 Essential (primary) hypertension: Secondary | ICD-10-CM

## 2021-01-25 DIAGNOSIS — E785 Hyperlipidemia, unspecified: Secondary | ICD-10-CM | POA: Diagnosis not present

## 2021-01-25 DIAGNOSIS — E119 Type 2 diabetes mellitus without complications: Secondary | ICD-10-CM

## 2021-01-25 LAB — POCT GLYCOSYLATED HEMOGLOBIN (HGB A1C): Hemoglobin A1C: 7.9 % — AB (ref 4.0–5.6)

## 2021-01-25 MED ORDER — DEXCOM G6 TRANSMITTER MISC: 1.0000 | Freq: Once | 0 refills | Status: AC

## 2021-01-25 MED ORDER — DEXCOM G6 SENSOR MISC: 1 refills | Status: DC

## 2021-01-25 MED ORDER — DEXCOM G6 RECEIVER DEVI: 1.0000 | Freq: Once | 0 refills | Status: AC

## 2021-01-25 NOTE — Progress Notes (Signed)
Subjective:    CC: Diabetes follow up  HPI: Pleasant 60 year old female presenting today for the following:  Diabetes-taking Janumet, Toujeo 42 units nightly, and Humalog as prescribed.  She does determine how much Humalog to take with each meal by counting carbs.  Has noted an increase in her average blood sugars since starting the statin.  This morning her sugars were 228 but yesterday was 106.  She has had hypoglycemia several times with her lowest reading in the 20s.  Is walking for exercise 7 days/week.  24-hour diet recall includes a scrambled egg sandwich on a croissant for breakfast, apples and blueberries at lunchtime, and steamed fish with spinach for dinner.  HTN-taking amlodipine 10 mg and losartan 25 mg daily.  Also taking Toprol-XL 50 mg daily.  Tolerating all of her medications well. Denies CP, SOB, palpitations, lower extremity edema, dizziness, headaches, or vision changes.  HLD-taking atorvastatin 20 mg as prescribed.  She did skip it for a week because she felt it was responsible for increasing her blood sugars.  No other side effects and is tolerating the medication very well overall.  I reviewed the past medical history, family history, social history, surgical history, and allergies today and no changes were needed.  Please see the problem list section below in epic for further details.  Past Medical History: Past Medical History:  Diagnosis Date  . Diabetes mellitus without complication (HCC)   . Diverticulosis   . History of rheumatic fever    age 52  . Hyperlipidemia   . Hypertension   . Migraine    Past Surgical History: Past Surgical History:  Procedure Laterality Date  . ABDOMINAL HYSTERECTOMY  1991  . CESAREAN SECTION     x 2  . CHOLECYSTECTOMY  1993  . LUMBAR [place]C SURGERY  2004   L5, discectomy and fusion   Social History: Social History   Socioeconomic History  . Marital status: Married    Spouse name: Not on file  . Number of children: Not on  file  . Years of education: Not on file  . Highest education level: Not on file  Occupational History  . Not on file  Tobacco Use  . Smoking status: Never Smoker  . Smokeless tobacco: Never Used  Vaping Use  . Vaping Use: Never used  Substance and Sexual Activity  . Alcohol use: No  . Drug use: No  . Sexual activity: Yes    Partners: Male    Birth control/protection: Surgical  Other Topics Concern  . Not on file  Social History Narrative   Moved from [place] 2016, originally from [facility] (parents/sisters in [place])   Homemaker   Completed 12th grade   2 daughter (6 grandchildren) one grandchild lives with her.   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   Social Determinants of Health   Financial Resource Strain: Not on file  Food Insecurity: Not on file  Transportation Needs: Not on file  Physical Activity: Not on file  Stress: Not on file  Social Connections: Not on file   Family History: Family History  Problem Relation Age of Onset  . Hypertension Father   . Alcohol abuse Paternal Grandfather   . Cancer Cousin        breast  . Cancer Paternal Aunt        breast   Allergies: Allergies  Allergen Reactions  .  Codeine Other (See Comments)    Dizziness, n/v, head itches  . Metoprolol Tartrate Other (See Comments)    Dull Headache   Medications: See med rec.  Review of Systems: See HPI for pertinent positives and negatives.   Objective:    General: Well Developed, well nourished, and in no acute distress.  Neuro: Alert and oriented x3.  HEENT: Normocephalic, atraumatic.  Skin: Warm and dry. Cardiac: Regular rate and rhythm, no murmurs rubs or gallops, no lower extremity edema.  Respiratory: Clear to auscultation bilaterally. Not using accessory muscles, speaking in full sentences.  Impression and Recommendations:    1. Type 1 diabetes mellitus with hyperglycemia  (HCC) POCT hemoglobin A1c 7.9%, up from 7.4% at last check.  Elevations of blood sugars in the morning suspicious for Somogyi effect.  It looks like her insurance will cover a Dexcom for continuous glucose monitoring which should provide better information about what her glucose is doing at night.  For now, continue Janumet, Toujeo, and Humalog as prescribed.  For now continue atorvastatin 20 mg daily.  We are rechecking some lab work today and will be able to determine our next steps. - POCT glycosylated hemoglobin (Hb A1C)  2. Primary hypertension Blood pressure well controlled.  Continue amlodipine, losartan, and Toprol-XL as prescribed.  3. Need for shingles vaccine It looks like she is already received [facility] at [facility].  Will call for administration dates so we can update our medical record.  Return in about 3 months (around 04/27/2021) for DM/HTN/HLD follow up. ___________________________________________ Thayer Ohm, DNP, APRN, FNP-BC [facility]

## 2021-01-25 NOTE — Patient Instructions (Addendum)
Look into [place]  Check out Somogyi effect  Carbohydrate Counting for Diabetes Mellitus, Adult Carbohydrate counting is a method of keeping track of how many carbohydrates you eat. Eating carbohydrates naturally increases the amount of sugar (glucose) in the blood. Counting how many carbohydrates you eat improves your blood glucose control, which helps you manage your diabetes. It is important to know how many carbohydrates you can safely have in each meal. This is different for every person. A dietitian can help you make a meal plan and calculate how many carbohydrates you should have at each meal and snack. What foods contain carbohydrates? Carbohydrates are found in the following foods:  Grains, such as breads and cereals.  Dried beans and soy products.  Starchy vegetables, such as potatoes, peas, and corn.  Fruit and fruit juices.  Milk and yogurt.  Sweets and snack foods, such as cake, cookies, candy, chips, and soft drinks.   How do I count carbohydrates in foods? There are two ways to count carbohydrates in food. You can read food labels or learn standard serving sizes of foods. You can use either of the methods or a combination of both. Using the Nutrition Facts label The Nutrition Facts list is included on the labels of almost all packaged foods and beverages in the [place] It includes:  The serving size.  Information about nutrients in each serving, including the grams (g) of carbohydrate per serving. To use the Nutrition Facts:  Decide how many servings you will have.  Multiply the number of servings by the number of carbohydrates per serving.  The resulting number is the total amount of carbohydrates that you will be having. Learning the standard serving sizes of foods When you eat carbohydrate foods that are not packaged or do not include Nutrition Facts on the label, you need to measure the servings in order to count the amount of carbohydrates.  Measure the foods  that you will eat with a food scale or measuring cup, if needed.  Decide how many standard-size servings you will eat.  Multiply the number of servings by 15. For foods that contain carbohydrates, one serving equals 15 g of carbohydrates. ? For example, if you eat 2 cups or 10 oz (300 g) of strawberries, you will have eaten 2 servings and 30 g of carbohydrates (2 servings x 15 g = 30 g).  For foods that have more than one food mixed, such as soups and casseroles, you must count the carbohydrates in each food that is included. The following list contains standard serving sizes of common carbohydrate-rich foods. Each of these servings has about 15 g of carbohydrates:  1 slice of bread.  1 six-inch (15 cm) tortilla.  ? cup or 2 oz (53 g) cooked rice or pasta.   cup or 3 oz (85 g) cooked or canned, drained and rinsed beans or lentils.   cup or 3 oz (85 g) starchy vegetable, such as peas, corn, or squash.   cup or 4 oz (120 g) hot cereal.   cup or 3 oz (85 g) boiled or mashed potatoes, or  or 3 oz (85 g) of a large baked potato.   cup or 4 fl oz (118 mL) fruit juice.  1 cup or 8 fl oz (237 mL) milk.  1 small or 4 oz (106 g) apple.   or 2 oz (63 g) of a medium banana.  1 cup or 5 oz (150 g) strawberries.  3 cups or 1 oz (24 g)  popped popcorn. What is an example of carbohydrate counting? To calculate the number of carbohydrates in this sample meal, follow the steps shown below. Sample meal  3 oz (85 g) chicken breast.  ? cup or 4 oz (106 g) brown rice.   cup or 3 oz (85 g) corn.  1 cup or 8 fl oz (237 mL) milk.  1 cup or 5 oz (150 g) strawberries with sugar-free whipped topping. Carbohydrate calculation 1. Identify the foods that contain carbohydrates: ? Rice. ? Corn. ? Milk. ? Strawberries. 2. Calculate how many servings you have of each food: ? 2 servings rice. ? 1 serving corn. ? 1 serving milk. ? 1 serving strawberries. 3. Multiply each number of  servings by 15 g: ? 2 servings rice x 15 g = 30 g. ? 1 serving corn x 15 g = 15 g. ? 1 serving milk x 15 g = 15 g. ? 1 serving strawberries x 15 g = 15 g. 4. Add together all of the amounts to find the total grams of carbohydrates eaten: ? 30 g + 15 g + 15 g + 15 g = 75 g of carbohydrates total. What are tips for following this plan? Shopping  Develop a meal plan and then make a shopping list.  Buy fresh and frozen vegetables, fresh and frozen fruit, dairy, eggs, beans, lentils, and whole grains.  Look at food labels. Choose foods that have more fiber and less sugar.  Avoid processed foods and foods with added sugars. Meal planning  Aim to have the same amount of carbohydrates at each meal and for each snack time.  Plan to have regular, balanced meals and snacks. Where to find more information  American Diabetes Association: www.diabetes.org  Centers for Disease Control and Prevention: [employer]Exhibition.com.br Summary  Carbohydrate counting is a method of keeping track of how many carbohydrates you eat.  Eating carbohydrates naturally increases the amount of sugar (glucose) in the blood.  Counting how many carbohydrates you eat improves your blood glucose control, which helps you manage your diabetes.  A dietitian can help you make a meal plan and calculate how many carbohydrates you should have at each meal and snack. This information is not intended to replace advice given to you by your health care provider. Make sure you discuss any questions you have with your health care provider. Document Revised: 08/06/2019 Document Reviewed: 08/07/2019 Elsevier Patient Education  2021 [employer].

## 2021-01-26 ENCOUNTER — Other Ambulatory Visit: Payer: Self-pay | Admitting: Medical-Surgical

## 2021-01-26 LAB — COMPLETE METABOLIC PANEL WITH GFR
AG Ratio: 1.6 (calc) (ref 1.0–2.5)
ALT: 27 U/L (ref 6–29)
AST: 15 U/L (ref 10–35)
Albumin: 4.5 g/dL (ref 3.6–5.1)
Alkaline phosphatase (APISO): 64 U/L (ref 37–153)
BUN: 19 mg/dL (ref 7–25)
CO2: 29 mmol/L (ref 20–32)
Calcium: 10 mg/dL (ref 8.6–10.4)
Chloride: 103 mmol/L (ref 98–110)
Creat: 0.76 mg/dL (ref 0.50–1.05)
GFR, Est African American: 100 mL/min/{1.73_m2} (ref >=60)
GFR, Est Non African American: 86 mL/min/{1.73_m2} (ref >=60)
Globulin: 2.8 g/dL (calc) (ref 1.9–3.7)
Glucose, Bld: 65 mg/dL (ref 65–99)
Potassium: 4.6 mmol/L (ref 3.5–5.3)
Sodium: 141 mmol/L (ref 135–146)
Total Bilirubin: 0.3 mg/dL (ref 0.2–1.2)
Total Protein: 7.3 g/dL (ref 6.1–8.1)

## 2021-01-26 LAB — LIPID PANEL
Cholesterol: 171 mg/dL (ref <200)
HDL: 47 mg/dL — ABNORMAL LOW (ref >=50)
LDL Cholesterol (Calc): 96 mg/dL (calc)
Non-HDL Cholesterol (Calc): 124 mg/dL (calc) (ref <130)
Total CHOL/HDL Ratio: 3.6 (calc) (ref <5.0)
Triglycerides: 186 mg/dL — ABNORMAL HIGH (ref <150)

## 2021-01-26 MED ORDER — PRAVASTATIN SODIUM 20 MG PO TABS: 20.0000 mg | ORAL_TABLET | Freq: Every day | ORAL | 3 refills | Status: DC

## 2021-02-09 ENCOUNTER — Encounter: Payer: Self-pay | Admitting: Medical-Surgical

## 2021-03-12 ENCOUNTER — Other Ambulatory Visit: Payer: Self-pay | Admitting: Medical-Surgical

## 2021-04-21 ENCOUNTER — Other Ambulatory Visit: Payer: Self-pay | Admitting: Medical-Surgical

## 2021-04-27 ENCOUNTER — Other Ambulatory Visit: Payer: Self-pay

## 2021-04-27 ENCOUNTER — Ambulatory Visit: Payer: BC Managed Care – PPO | Admitting: Medical-Surgical

## 2021-04-27 ENCOUNTER — Encounter: Payer: Self-pay | Admitting: Medical-Surgical

## 2021-04-27 VITALS — BP 145/80 | HR 81 | Resp 20 | Ht 59.0 in | Wt 179.0 lb

## 2021-04-27 DIAGNOSIS — E1169 Type 2 diabetes mellitus with other specified complication: Secondary | ICD-10-CM

## 2021-04-27 DIAGNOSIS — E785 Hyperlipidemia, unspecified: Secondary | ICD-10-CM | POA: Diagnosis not present

## 2021-04-27 DIAGNOSIS — E1065 Type 1 diabetes mellitus with hyperglycemia: Secondary | ICD-10-CM | POA: Diagnosis not present

## 2021-04-27 DIAGNOSIS — I10 Essential (primary) hypertension: Secondary | ICD-10-CM | POA: Diagnosis not present

## 2021-04-27 DIAGNOSIS — Z23 Encounter for immunization: Secondary | ICD-10-CM | POA: Diagnosis not present

## 2021-04-27 DIAGNOSIS — E119 Type 2 diabetes mellitus without complications: Secondary | ICD-10-CM

## 2021-04-27 LAB — COMPLETE METABOLIC PANEL WITH GFR
AG Ratio: 1.6 (calc) (ref 1.0–2.5)
ALT: 17 U/L (ref 6–29)
AST: 17 U/L (ref 10–35)
Albumin: 4.4 g/dL (ref 3.6–5.1)
Alkaline phosphatase (APISO): 50 U/L (ref 37–153)
BUN: 14 mg/dL (ref 7–25)
CO2: 26 mmol/L (ref 20–32)
Calcium: 10.2 mg/dL (ref 8.6–10.4)
Chloride: 103 mmol/L (ref 98–110)
Creat: 0.66 mg/dL (ref 0.50–1.03)
Globulin: 2.8 g/dL (calc) (ref 1.9–3.7)
Glucose, Bld: 81 mg/dL (ref 65–139)
Potassium: 4.7 mmol/L (ref 3.5–5.3)
Sodium: 141 mmol/L (ref 135–146)
Total Bilirubin: 0.2 mg/dL (ref 0.2–1.2)
Total Protein: 7.2 g/dL (ref 6.1–8.1)
eGFR: 101 mL/min/{1.73_m2} (ref >=60)

## 2021-04-27 LAB — LIPID PANEL
Cholesterol: 201 mg/dL — ABNORMAL HIGH (ref <200)
HDL: 46 mg/dL — ABNORMAL LOW (ref >=50)
LDL Cholesterol (Calc): 120 mg/dL (calc) — ABNORMAL HIGH
Non-HDL Cholesterol (Calc): 155 mg/dL (calc) — ABNORMAL HIGH (ref <130)
Total CHOL/HDL Ratio: 4.4 (calc) (ref <5.0)
Triglycerides: 232 mg/dL — ABNORMAL HIGH (ref <150)

## 2021-04-27 LAB — POCT GLYCOSYLATED HEMOGLOBIN (HGB A1C): HbA1c, POC (controlled diabetic range): 7.4 % — AB (ref 0.0–7.0)

## 2021-04-27 MED ORDER — FREESTYLE LIBRE 14 DAY READER DEVI: 1.0000 "application " | 11 refills | Status: DC

## 2021-04-27 MED ORDER — LOSARTAN POTASSIUM 50 MG PO TABS: 25.0000 mg | ORAL_TABLET | Freq: Every day | ORAL | 0 refills | Status: DC

## 2021-04-27 MED ORDER — FREESTYLE LIBRE 14 DAY SENSOR MISC: 1.0000 "application " | 11 refills | Status: DC

## 2021-04-27 NOTE — Progress Notes (Signed)
HPI with pertinent ROS:   CC: DM/HTN/HLD follow up  HPI:  Diabetes She presents for her follow-up diabetic visit. She has type 1 diabetes mellitus. No MedicAlert identification noted. The initial diagnosis of diabetes was made 11 years ago. Her disease course has been stable. (nausea) There are no diabetic associated symptoms. Pertinent negatives for diabetes include no chest pain. There are no hypoglycemic complications. Symptoms are stable. There are no diabetic complications. Pertinent negatives for diabetic complications include no CVA, PVD or retinopathy. Risk factors for coronary artery disease include dyslipidemia, diabetes mellitus, hypertension, post-menopausal and obesity. Current diabetic treatment includes diet and insulin injections. She is compliant with treatment all of the time. She is currently taking insulin pre-breakfast, pre-lunch and pre-dinner. Insulin injections are given by patient. Her weight is stable. She is following a diabetic and generally healthy diet. Meal planning includes avoidance of concentrated sweets. She has not had a previous visit with a dietitian. She participates in exercise intermittently. She monitors blood glucose at home 3-4 x per day. Blood glucose monitoring compliance is excellent. There is no change in her home blood glucose trend. An ACE inhibitor/angiotensin II receptor blocker is being taken. She does not see a podiatrist.Eye exam is current.  Hypertension This is a chronic problem. The problem is unchanged. The problem is controlled. Pertinent negatives include no chest pain or shortness of breath. Past treatments include beta blockers and angiotensin blockers. The current treatment provides mild improvement. There are no compliance problems.  There is no history of angina, kidney disease, CAD/MI, CVA, heart failure, left ventricular hypertrophy, PVD or retinopathy. There is no history of chronic renal disease, a hypertension causing med, renovascular  disease or a thyroid problem.  Hyperlipidemia This is a chronic problem. The problem is controlled. Recent lipid tests were reviewed and are normal. She has no history of chronic renal disease. Pertinent negatives include no chest pain, leg pain, myalgias or shortness of breath. Current antihyperlipidemic treatment includes statins. The current treatment provides significant improvement of lipids. There are no compliance problems.    I reviewed the past medical history, family history, social history, surgical history, and allergies today and no changes were needed.  Please see the problem list section below in epic for further details.   Physical exam:   General: Well Developed, well nourished, and in no acute distress.  Neuro: Alert and oriented x3.  HEENT: Normocephalic, atraumatic.  Skin: Warm and dry. Cardiac: Regular rate and rhythm, no murmurs rubs or gallops, no lower extremity edema.  Respiratory: Clear to auscultation bilaterally. Not using accessory muscles, speaking in full sentences.  Impression and Recommendations:    1. Type 1 diabetes mellitus with hyperglycemia (HCC) POCT hemoglobin A1c 7.4% today.  Continue with current treatment using 46 units of Toujeo nightly and mealtime insulin.  Recommend looking into Macro diet for diabetics as this may help with her dietary balance while still being able to eat the food she loves.  Unfortunately, Dexcom was not covered well by her insurance would have been a $300 out-of-pocket cost so we are sending in freestyle libre to see if this is more affordable since she does check her sugars 3-4 times daily every day for insulin administration - POCT glycosylated hemoglobin (Hb A1C)  2. Primary hypertension Blood pressure not controlled today even after recheck.  She has been under a lot of stress lately and has been making less than stellar dietary choices.  Increasing losartan to 50 mg daily.  Recommend monitoring blood pressure  over the  next couple of weeks with a goal of 130/80 or less.  Return in 2 weeks for a nurse visit to check response to medication increase.  Discussed lowering sodium in her diet.  3. Hyperlipidemia associated with type 2 diabetes mellitus (HCC) Checking CMP and lipid panel today.  Continue pravastatin as prescribed. - COMPLETE METABOLIC PANEL WITH GFR - Lipid panel  4. Flu vaccine need Flu vaccine given in office today. - Flu Vaccine QUAD 83mo+IM (Fluarix, Fluzone & Alfiuria Quad PF)  Return in about 3 months (around 07/27/2021) for DM/HTN/HLD follow up. ___________________________________________ Thayer Ohm, DNP, APRN, FNP-BC Primary Care and Sports Medicine [facility] [place]

## 2021-04-28 ENCOUNTER — Other Ambulatory Visit: Payer: Self-pay | Admitting: Neurology

## 2021-04-28 MED ORDER — FREESTYLE LIBRE 2 SENSOR MISC: 1.0000 | Freq: Every day | 11 refills | Status: DC

## 2021-04-28 MED ORDER — PRAVASTATIN SODIUM 40 MG PO TABS: 40.0000 mg | ORAL_TABLET | Freq: Every day | ORAL | 3 refills | Status: DC

## 2021-04-28 MED ORDER — FREESTYLE LIBRE 2 READER DEVI: 1.0000 | Freq: Every day | 11 refills | Status: AC

## 2021-04-28 NOTE — Addendum Note (Signed)
Addended byChristen Butter on: 04/28/2021 12:52 PM   Modules accepted: Orders

## 2021-04-28 NOTE — Telephone Encounter (Signed)
Received note from pharmacy that only the [place] 2 was approved through insurance (not regular libre). RX sent.

## 2021-05-11 ENCOUNTER — Ambulatory Visit: Payer: BC Managed Care – PPO

## 2021-05-18 ENCOUNTER — Other Ambulatory Visit: Payer: Self-pay | Admitting: Medical-Surgical

## 2021-05-18 DIAGNOSIS — Z1231 Encounter for screening mammogram for malignant neoplasm of breast: Secondary | ICD-10-CM

## 2021-05-25 ENCOUNTER — Ambulatory Visit (INDEPENDENT_AMBULATORY_CARE_PROVIDER_SITE_OTHER): Payer: BC Managed Care – PPO

## 2021-05-25 ENCOUNTER — Other Ambulatory Visit: Payer: Self-pay

## 2021-05-25 DIAGNOSIS — Z1231 Encounter for screening mammogram for malignant neoplasm of breast: Secondary | ICD-10-CM | POA: Diagnosis not present

## 2021-05-26 ENCOUNTER — Other Ambulatory Visit: Payer: Self-pay | Admitting: Medical-Surgical

## 2021-07-27 ENCOUNTER — Ambulatory Visit: Payer: BC Managed Care – PPO | Admitting: Medical-Surgical

## 2021-08-07 ENCOUNTER — Other Ambulatory Visit: Payer: Self-pay | Admitting: Medical-Surgical

## 2021-08-25 ENCOUNTER — Ambulatory Visit: Payer: BC Managed Care – PPO | Admitting: Medical-Surgical

## 2021-08-25 ENCOUNTER — Encounter: Payer: Self-pay | Admitting: Medical-Surgical

## 2021-08-25 ENCOUNTER — Other Ambulatory Visit: Payer: Self-pay

## 2021-08-25 VITALS — BP 137/87 | HR 77 | Resp 20 | Ht 59.0 in | Wt 180.0 lb

## 2021-08-25 DIAGNOSIS — G43909 Migraine, unspecified, not intractable, without status migrainosus: Secondary | ICD-10-CM | POA: Diagnosis not present

## 2021-08-25 DIAGNOSIS — E785 Hyperlipidemia, unspecified: Secondary | ICD-10-CM

## 2021-08-25 DIAGNOSIS — I10 Essential (primary) hypertension: Secondary | ICD-10-CM | POA: Diagnosis not present

## 2021-08-25 DIAGNOSIS — M79602 Pain in left arm: Secondary | ICD-10-CM

## 2021-08-25 DIAGNOSIS — E1169 Type 2 diabetes mellitus with other specified complication: Secondary | ICD-10-CM | POA: Diagnosis not present

## 2021-08-25 DIAGNOSIS — E119 Type 2 diabetes mellitus without complications: Secondary | ICD-10-CM

## 2021-08-25 LAB — POCT GLYCOSYLATED HEMOGLOBIN (HGB A1C): HbA1c, POC (controlled diabetic range): 6.9 % (ref 0.0–7.0)

## 2021-08-25 NOTE — Progress Notes (Signed)
HPI with pertinent ROS:   CC: Diabetes/HTN/HDL follow up  HPI: Pleasant 61 year old female presenting today for follow-up on:  Diabetes-doing much better with her overall dietary and lifestyle modifications.  She is continuing to carb count with her meals.  Using a 48 units of Toujeo nightly and insulin lispro with meals on a sliding scale.  Also using Janumet as prescribed.  Checked her blood sugar this morning with a result of 111 fasting but did have hypoglycemia to 39 last night.  She does sometimes have hypoglycemic episodes when she has increased activity throughout the day.  Hypertension-taking losartan 50 mg daily, tolerating well without side effects.  Occasionally checks blood pressures at home but only when feeling under the weather or having concerning symptoms.  Denies CP, SOB, palpitations, lower extremity edema, dizziness, headaches, or vision changes.  Hyperlipidemia-taking pravastatin 40 mg daily as prescribed, tolerating well without side effects.  Following a low-fat diet.  Migraines-has Imitrex 100 mg tablets to use as an abortive medication however she does not seem to tolerate this medication and it makes her feel worse.  She has tried different medications in the past but the cost was out of the reasonable range.  She is taking metoprolol 50 mg daily which does seem to help.  Is interested in other medications to use as an abortive agent.  Left arm pain-this started after her last lab draw back in September.  She was stuck 5 times trying to get blood and she had some significant aches and pains in the left upper arm from the shoulder to the [facility] area.  That has gradually resolved but she does still have some discomfort.  She is currently using ibuprofen for dental pain management which seems to have helped resolve some of the left arm pain.   I reviewed the past medical history, family history, social history, surgical history, and allergies today and no changes were needed.   Please see the problem list section below in epic for further details.   Physical exam:   General: Well Developed, well nourished, and in no acute distress.  Neuro: Alert and oriented x3.  HEENT: Normocephalic, atraumatic.  Skin: Warm and dry. Cardiac: Regular rate and rhythm, no murmurs rubs or gallops, no lower extremity edema.  Respiratory: Clear to auscultation bilaterally. Not using accessory muscles, speaking in full sentences.  Impression and Recommendations:    1. Diabetes mellitus without complication (HCC) POCT hemoglobin A1c down to 6.9%.  Continue current regimen of Janumet, insulin lispro with meals, and Toujeo 48 units nightly.  Continue managing blood glucose at home and avoid skipping meals.  Monitor hypotensive episodes and if continuing to experience these regularly, we may need to her insulin at night. - POCT glycosylated hemoglobin (Hb A1C)  2. Hyperlipidemia associated with type 2 diabetes mellitus (HCC) Checking lipid panel today.  Continue pravastatin 40 mg daily as prescribed. - Lipid panel - CBC with Differential  3. Primary hypertension Blood pressure at goal.  Checking labs.  Continue metoprolol 50 mg daily and losartan 50 mg daily.  Continue low-sodium diet.  Recommend intentional exercise at least 3 times weekly. - Lipid panel - CBC with Differential  4. Migraine without status migrainosus, not intractable, unspecified migraine type Continue metoprolol for migraine prevention.  Sending in Nurtec to see if this will be covered by her insurance.  5. Left arm pain This seems to be gradually resolving.  Unclear whether this is a nerve issue that is related to her lab draw or  not.  Recommend using ibuprofen regularly every 8 hours for the next 7 days to see if this helps fully resolve her issues.  Return in about 6 months (around 02/22/2022) for DM/HTN/HLD follow up. ___________________________________________ Thayer Ohm, DNP, APRN, FNP-BC Primary Care  and Sports Medicine [facility] [place]

## 2021-08-26 ENCOUNTER — Other Ambulatory Visit: Payer: Self-pay | Admitting: Medical-Surgical

## 2021-08-26 LAB — LIPID PANEL
Cholesterol: 119 mg/dL (ref <200)
HDL: 45 mg/dL — ABNORMAL LOW (ref >=50)
LDL Cholesterol (Calc): 49 mg/dL (calc)
Non-HDL Cholesterol (Calc): 74 mg/dL (calc) (ref <130)
Total CHOL/HDL Ratio: 2.6 (calc) (ref <5.0)
Triglycerides: 174 mg/dL — ABNORMAL HIGH (ref <150)

## 2021-08-26 LAB — CBC WITH DIFFERENTIAL/PLATELET
Absolute Monocytes: 485 cells/uL (ref 200–950)
Basophils Absolute: 43 cells/uL (ref 0–200)
Basophils Relative: 0.5 %
Eosinophils Absolute: 961 cells/uL — ABNORMAL HIGH (ref 15–500)
Eosinophils Relative: 11.3 %
HCT: 33.9 % — ABNORMAL LOW (ref 35.0–45.0)
Hemoglobin: 10 g/dL — ABNORMAL LOW (ref 11.7–15.5)
Lymphs Abs: 1955 cells/uL (ref 850–3900)
MCH: 21.2 pg — ABNORMAL LOW (ref 27.0–33.0)
MCHC: 29.5 g/dL — ABNORMAL LOW (ref 32.0–36.0)
MCV: 72 fL — ABNORMAL LOW (ref 80.0–100.0)
MPV: 10.2 fL (ref 7.5–12.5)
Monocytes Relative: 5.7 %
Neutro Abs: 5058 cells/uL (ref 1500–7800)
Neutrophils Relative %: 59.5 %
Platelets: 425 10*3/uL — ABNORMAL HIGH (ref 140–400)
RBC: 4.71 10*6/uL (ref 3.80–5.10)
RDW: 15.6 % — ABNORMAL HIGH (ref 11.0–15.0)
Total Lymphocyte: 23 %
WBC: 8.5 10*3/uL (ref 3.8–10.8)

## 2021-08-28 ENCOUNTER — Encounter: Payer: Self-pay | Admitting: Medical-Surgical

## 2021-08-28 NOTE — Telephone Encounter (Signed)
Spoke with pt via telephone and given instructions to take ferrous sulfate 325mg  daily.  Pt expressed understanding and is agreeable.  , CMA

## 2021-08-29 ENCOUNTER — Other Ambulatory Visit: Payer: Self-pay

## 2021-08-29 ENCOUNTER — Other Ambulatory Visit: Payer: Self-pay | Admitting: Medical-Surgical

## 2021-08-29 DIAGNOSIS — J984 Other disorders of lung: Secondary | ICD-10-CM

## 2021-08-29 DIAGNOSIS — E119 Type 2 diabetes mellitus without complications: Secondary | ICD-10-CM

## 2021-08-29 MED ORDER — FLUTICASONE PROPIONATE 50 MCG/ACT NA SUSP: NASAL | 1 refills | Status: DC

## 2021-09-06 ENCOUNTER — Other Ambulatory Visit: Payer: Self-pay | Admitting: Medical-Surgical

## 2021-09-06 DIAGNOSIS — J984 Other disorders of lung: Secondary | ICD-10-CM

## 2021-10-20 ENCOUNTER — Other Ambulatory Visit: Payer: Self-pay | Admitting: Medical-Surgical

## 2021-10-24 ENCOUNTER — Encounter: Payer: Self-pay | Admitting: Medical-Surgical

## 2021-10-24 MED ORDER — GLUCOSE BLOOD VI STRP: ORAL_STRIP | 3 refills | Status: DC

## 2021-11-20 ENCOUNTER — Other Ambulatory Visit: Payer: Self-pay | Admitting: Medical-Surgical

## 2021-12-08 ENCOUNTER — Other Ambulatory Visit: Payer: Self-pay | Admitting: Medical-Surgical

## 2021-12-11 ENCOUNTER — Encounter: Payer: Self-pay | Admitting: Medical-Surgical

## 2021-12-11 ENCOUNTER — Telehealth: Payer: Self-pay

## 2021-12-11 MED ORDER — METOPROLOL SUCCINATE ER 50 MG PO TB24: 50.0000 mg | ORAL_TABLET | Freq: Every day | ORAL | 3 refills | Status: DC

## 2021-12-11 NOTE — Telephone Encounter (Signed)
Opened in error

## 2021-12-20 ENCOUNTER — Other Ambulatory Visit: Payer: Self-pay | Admitting: Medical-Surgical

## 2021-12-20 DIAGNOSIS — Z1231 Encounter for screening mammogram for malignant neoplasm of breast: Secondary | ICD-10-CM

## 2021-12-26 ENCOUNTER — Other Ambulatory Visit: Payer: Self-pay | Admitting: Medical-Surgical

## 2022-01-02 ENCOUNTER — Other Ambulatory Visit: Payer: Self-pay | Admitting: Medical-Surgical

## 2022-01-03 ENCOUNTER — Other Ambulatory Visit: Payer: Self-pay | Admitting: Medical-Surgical

## 2022-01-03 NOTE — Telephone Encounter (Signed)
Last Refill 04/27/2021 ?90 days  ?NO Refill ? ?

## 2022-01-03 NOTE — Telephone Encounter (Signed)
Dose changed to 50mg.

## 2022-01-05 ENCOUNTER — Encounter: Payer: Self-pay | Admitting: Medical-Surgical

## 2022-01-05 MED ORDER — LOSARTAN POTASSIUM 50 MG PO TABS: 50.0000 mg | ORAL_TABLET | Freq: Every day | ORAL | 0 refills | Status: DC

## 2022-01-17 ENCOUNTER — Other Ambulatory Visit: Payer: Self-pay | Admitting: Medical-Surgical

## 2022-01-22 ENCOUNTER — Other Ambulatory Visit: Payer: Self-pay | Admitting: Medical-Surgical

## 2022-02-22 ENCOUNTER — Ambulatory Visit: Payer: BC Managed Care – PPO | Admitting: Medical-Surgical

## 2022-03-02 ENCOUNTER — Ambulatory Visit: Payer: BC Managed Care – PPO | Admitting: Medical-Surgical

## 2022-03-02 ENCOUNTER — Encounter: Payer: Self-pay | Admitting: Medical-Surgical

## 2022-03-02 VITALS — BP 161/95 | HR 91 | Ht 59.0 in | Wt 178.1 lb

## 2022-03-02 DIAGNOSIS — I10 Essential (primary) hypertension: Secondary | ICD-10-CM | POA: Diagnosis not present

## 2022-03-02 DIAGNOSIS — E119 Type 2 diabetes mellitus without complications: Secondary | ICD-10-CM

## 2022-03-02 DIAGNOSIS — E1065 Type 1 diabetes mellitus with hyperglycemia: Secondary | ICD-10-CM

## 2022-03-02 DIAGNOSIS — D509 Iron deficiency anemia, unspecified: Secondary | ICD-10-CM

## 2022-03-02 LAB — POCT GLYCOSYLATED HEMOGLOBIN (HGB A1C): HbA1c POC (<> result, manual entry): 7.9 % (ref 4.0–5.6)

## 2022-03-02 LAB — POCT UA - MICROALBUMIN
Albumin/Creatinine Ratio, Urine, POC: <30
Creatinine, POC: 100 mg/dL
Microalbumin Ur, POC: 30 mg/L

## 2022-03-02 MED ORDER — TOUJEO SOLOSTAR 300 UNIT/ML ~~LOC~~ SOPN: 50.0000 [IU] | PEN_INJECTOR | Freq: Every day | SUBCUTANEOUS | 4 refills | Status: DC

## 2022-03-02 NOTE — Progress Notes (Signed)
Established Patient Office Visit  Subjective   Patient ID: Kathryn Rogers, female   DOB: 25-Jan-1961 Age: 61 y.o. MRN: [id number]   Chief Complaint  Patient presents with   Follow-up    HPI Pleasant 61 year old female presented today for the following:  Hypertension: BP elevated today. Occasionally, checks at home but not recently. Taking Amlodipine, Losartan, and Toprol-XL as prescribed.  Does occasionally have some lower extremity edema but this usually mild and resolves with elevation at the end of the day.  Denies CP, SOB, palpitations, dizziness, headaches, or vision changes.  Hyperlipidemia: taking pravastatin 20 mg daily, tolerating well without side effects.  Feels this is doing much better and is no longer affecting her glucose.  Type 1 diabetes: on 7/5, woke up with a fasting sugar of 534. No s/s outside of fatigue. Increased her activity and water intake. Switched out her insulin pen and was able to get it down. Wonders if there was something wrong with the insulin. Elevated readings last for a few days and are back to normal.  Taking Toujeo 50 units daily along with Humalog on a carb counting scale.   Objective:    Vitals:   03/02/22 0803  BP: (!) 161/95  Pulse: 91  Height: 4\' 11"  (1.499 m)  Weight: 178 lb 2.1 oz (80.8 kg)  SpO2: 97%  BMI (Calculated): 35.96    Physical Exam Vitals and nursing note reviewed.  Constitutional:      General: She is not in acute distress.    Appearance: Normal appearance. She is not ill-appearing.  HENT:     Head: Normocephalic and atraumatic.  Cardiovascular:     Rate and Rhythm: Normal rate and regular rhythm.     Pulses: Normal pulses.     Heart sounds: Normal heart sounds.  Pulmonary:     Effort: Pulmonary effort is normal. No respiratory distress.     Breath sounds: Normal breath sounds. No wheezing, rhonchi or rales.  Skin:    General: Skin is warm and dry.  Neurological:     Mental Status: She is alert and oriented to  person, place, and time.  Psychiatric:        Mood and Affect: Mood normal.        Behavior: Behavior normal.        Thought Content: Thought content normal.        Judgment: Judgment normal.     Results for orders placed or performed in visit on 03/02/22 (from the past 24 hour(s))  POCT HgB A1C     Status: Abnormal   Collection Time: 03/02/22  8:14 AM  Result Value Ref Range   Hemoglobin A1C     HbA1c POC (<> result, manual entry) 7.9 4.0 - 5.6 %   HbA1c, POC (prediabetic range)     HbA1c, POC (controlled diabetic range)    POCT UA - Microalbumin     Status: Normal   Collection Time: 03/02/22  8:15 AM  Result Value Ref Range   Microalbumin Ur, POC 30 mg/L   Creatinine, POC 100 mg/dL   Albumin/Creatinine Ratio, Urine, POC <30        The ASCVD Risk score (Arnett DK, et al., 2019) failed to calculate for the following reasons:   The valid total cholesterol range is 130 to 320 mg/dL   Assessment & Plan:   1. Type 1 diabetes mellitus with hyperglycemia (HCC) POCT hemoglobin A1c up to 7.9% today.  Microalbumin is normal.  Checking labs today.  Continue Toujeo and Humalog as prescribed.  Work on tighter control of glucose to get A1c back down to normal.  Advised her to contact the manufacturer of her Humalog pen to see if they will send her a replacement since this does appear to be related to a malfunctioning device - POCT HgB A1C - POCT UA - Microalbumin - Lipid panel  2. Primary hypertension Checking labs as below.  Blood pressure significantly elevated today.  Continue amlodipine, Toprol-XL, and losartan as prescribed.  Monitor blood pressures at home with goal of 130/80 or less.  Low-sodium diet and regular intentional exercise recommended. - CBC with Differential - Lipid panel - COMPLETE METABOLIC PANEL WITH GFR  3. Iron deficiency anemia, unspecified iron deficiency anemia type Checking blood counts and iron panel today. - CBC with Differential - Fe+TIBC+Fer  4.  Diabetes mellitus without complication (HCC) Refilling Toujeo at new dose of 50 units daily. - insulin glargine, 1 Unit Dial, (TOUJEO SOLOSTAR) 300 UNIT/ML Solostar Pen; Inject 50 Units into the skin daily.  Dispense: 9 mL; Refill: 4  Return in about 3 months (around 06/02/2022) for DM/HTN/HLD follow up.  ___________________________________________ Thayer Ohm, DNP, APRN, FNP-BC Primary Care and Sports Medicine [facility] [place]

## 2022-03-03 LAB — COMPLETE METABOLIC PANEL WITH GFR
AG Ratio: 1.5 (calc) (ref 1.0–2.5)
ALT: 16 U/L (ref 6–29)
AST: 14 U/L (ref 10–35)
Albumin: 4.4 g/dL (ref 3.6–5.1)
Alkaline phosphatase (APISO): 54 U/L (ref 37–153)
BUN: 14 mg/dL (ref 7–25)
CO2: 28 mmol/L (ref 20–32)
Calcium: 10.2 mg/dL (ref 8.6–10.4)
Chloride: 103 mmol/L (ref 98–110)
Creat: 0.79 mg/dL (ref 0.50–1.05)
Globulin: 3 g/dL (calc) (ref 1.9–3.7)
Glucose, Bld: 94 mg/dL (ref 65–99)
Potassium: 4.9 mmol/L (ref 3.5–5.3)
Sodium: 141 mmol/L (ref 135–146)
Total Bilirubin: 0.3 mg/dL (ref 0.2–1.2)
Total Protein: 7.4 g/dL (ref 6.1–8.1)
eGFR: 86 mL/min/{1.73_m2} (ref >=60)

## 2022-03-03 LAB — CBC WITH DIFFERENTIAL/PLATELET
Absolute Monocytes: 546 cells/uL (ref 200–950)
Basophils Absolute: 88 cells/uL (ref 0–200)
Basophils Relative: 1 %
Eosinophils Absolute: 977 cells/uL — ABNORMAL HIGH (ref 15–500)
Eosinophils Relative: 11.1 %
HCT: 37.3 % (ref 35.0–45.0)
Hemoglobin: 11.1 g/dL — ABNORMAL LOW (ref 11.7–15.5)
Lymphs Abs: 2121 cells/uL (ref 850–3900)
MCH: 21.6 pg — ABNORMAL LOW (ref 27.0–33.0)
MCHC: 29.8 g/dL — ABNORMAL LOW (ref 32.0–36.0)
MCV: 72.7 fL — ABNORMAL LOW (ref 80.0–100.0)
MPV: 10.2 fL (ref 7.5–12.5)
Monocytes Relative: 6.2 %
Neutro Abs: 5069 cells/uL (ref 1500–7800)
Neutrophils Relative %: 57.6 %
Platelets: 408 10*3/uL — ABNORMAL HIGH (ref 140–400)
RBC: 5.13 10*6/uL — ABNORMAL HIGH (ref 3.80–5.10)
RDW: 16.2 % — ABNORMAL HIGH (ref 11.0–15.0)
Total Lymphocyte: 24.1 %
WBC: 8.8 10*3/uL (ref 3.8–10.8)

## 2022-03-03 LAB — IRON,TIBC AND FERRITIN PANEL
%SAT: 7 % (calc) — ABNORMAL LOW (ref 16–45)
Ferritin: 3 ng/mL — ABNORMAL LOW (ref 16–232)
Iron: 33 ug/dL — ABNORMAL LOW (ref 45–160)
TIBC: 459 mcg/dL (calc) — ABNORMAL HIGH (ref 250–450)

## 2022-03-03 LAB — LIPID PANEL
Cholesterol: 173 mg/dL (ref <200)
HDL: 46 mg/dL — ABNORMAL LOW (ref >=50)
LDL Cholesterol (Calc): 96 mg/dL (calc)
Non-HDL Cholesterol (Calc): 127 mg/dL (calc) (ref <130)
Total CHOL/HDL Ratio: 3.8 (calc) (ref <5.0)
Triglycerides: 215 mg/dL — ABNORMAL HIGH (ref <150)

## 2022-03-05 ENCOUNTER — Encounter: Payer: Self-pay | Admitting: Medical-Surgical

## 2022-03-05 DIAGNOSIS — D509 Iron deficiency anemia, unspecified: Secondary | ICD-10-CM

## 2022-03-09 ENCOUNTER — Inpatient Hospital Stay: Payer: BC Managed Care – PPO | Admitting: Family

## 2022-03-09 ENCOUNTER — Inpatient Hospital Stay: Payer: BC Managed Care – PPO | Attending: Hematology & Oncology

## 2022-03-09 ENCOUNTER — Other Ambulatory Visit: Payer: Self-pay | Admitting: *Deleted

## 2022-03-09 ENCOUNTER — Other Ambulatory Visit: Payer: Self-pay

## 2022-03-09 ENCOUNTER — Encounter: Payer: Self-pay | Admitting: Family

## 2022-03-09 VITALS — BP 123/70 | HR 78 | Temp 98.2°F | Resp 18 | Ht 59.0 in | Wt 180.1 lb

## 2022-03-09 DIAGNOSIS — D649 Anemia, unspecified: Secondary | ICD-10-CM

## 2022-03-09 DIAGNOSIS — E109 Type 1 diabetes mellitus without complications: Secondary | ICD-10-CM | POA: Diagnosis not present

## 2022-03-09 DIAGNOSIS — Z803 Family history of malignant neoplasm of breast: Secondary | ICD-10-CM

## 2022-03-09 DIAGNOSIS — Z794 Long term (current) use of insulin: Secondary | ICD-10-CM | POA: Insufficient documentation

## 2022-03-09 DIAGNOSIS — R140 Abdominal distension (gaseous): Secondary | ICD-10-CM

## 2022-03-09 DIAGNOSIS — Z9049 Acquired absence of other specified parts of digestive tract: Secondary | ICD-10-CM | POA: Insufficient documentation

## 2022-03-09 DIAGNOSIS — R1084 Generalized abdominal pain: Secondary | ICD-10-CM

## 2022-03-09 DIAGNOSIS — K921 Melena: Secondary | ICD-10-CM

## 2022-03-09 DIAGNOSIS — R110 Nausea: Secondary | ICD-10-CM

## 2022-03-09 DIAGNOSIS — E108 Type 1 diabetes mellitus with unspecified complications: Secondary | ICD-10-CM | POA: Insufficient documentation

## 2022-03-09 DIAGNOSIS — D509 Iron deficiency anemia, unspecified: Secondary | ICD-10-CM

## 2022-03-09 DIAGNOSIS — Z79899 Other long term (current) drug therapy: Secondary | ICD-10-CM | POA: Diagnosis not present

## 2022-03-09 LAB — CMP (CANCER CENTER ONLY)
ALT: 14 U/L (ref 0–44)
AST: 14 U/L — ABNORMAL LOW (ref 15–41)
Albumin: 4.4 g/dL (ref 3.5–5.0)
Alkaline Phosphatase: 54 U/L (ref 38–126)
Anion gap: 8 (ref 5–15)
BUN: 14 mg/dL (ref 6–20)
CO2: 29 mmol/L (ref 22–32)
Calcium: 9.6 mg/dL (ref 8.9–10.3)
Chloride: 105 mmol/L (ref 98–111)
Creatinine: 0.81 mg/dL (ref 0.44–1.00)
GFR, Estimated: >60 mL/min (ref >60)
Glucose, Bld: 95 mg/dL (ref 70–99)
Potassium: 4.5 mmol/L (ref 3.5–5.1)
Sodium: 142 mmol/L (ref 135–145)
Total Bilirubin: 0.3 mg/dL (ref 0.3–1.2)
Total Protein: 7 g/dL (ref 6.5–8.1)

## 2022-03-09 LAB — CBC WITH DIFFERENTIAL (CANCER CENTER ONLY)
Abs Immature Granulocytes: 0.07 10*3/uL (ref 0.00–0.07)
Basophils Absolute: 0 10*3/uL (ref 0.0–0.1)
Basophils Relative: 1 %
Eosinophils Absolute: 1 10*3/uL — ABNORMAL HIGH (ref 0.0–0.5)
Eosinophils Relative: 12 %
HCT: 35.8 % — ABNORMAL LOW (ref 36.0–46.0)
Hemoglobin: 10.5 g/dL — ABNORMAL LOW (ref 12.0–15.0)
Immature Granulocytes: 1 %
Lymphocytes Relative: 23 %
Lymphs Abs: 1.9 10*3/uL (ref 0.7–4.0)
MCH: 21.7 pg — ABNORMAL LOW (ref 26.0–34.0)
MCHC: 29.3 g/dL — ABNORMAL LOW (ref 30.0–36.0)
MCV: 74 fL — ABNORMAL LOW (ref 80.0–100.0)
Monocytes Absolute: 0.4 10*3/uL (ref 0.1–1.0)
Monocytes Relative: 5 %
Neutro Abs: 5 10*3/uL (ref 1.7–7.7)
Neutrophils Relative %: 58 %
Platelet Count: 347 10*3/uL (ref 150–400)
RBC: 4.84 MIL/uL (ref 3.87–5.11)
RDW: 16.1 % — ABNORMAL HIGH (ref 11.5–15.5)
WBC Count: 8.4 10*3/uL (ref 4.0–10.5)
nRBC: 0 % (ref 0.0–0.2)

## 2022-03-09 LAB — RETIC PANEL
Immature Retic Fract: 30.4 % — ABNORMAL HIGH (ref 2.3–15.9)
RBC.: 4.73 MIL/uL (ref 3.87–5.11)
Retic Count, Absolute: 57.7 10*3/uL (ref 19.0–186.0)
Retic Ct Pct: 1.2 % (ref 0.4–3.1)
Reticulocyte Hemoglobin: 24.5 pg — ABNORMAL LOW (ref >27.9)

## 2022-03-09 NOTE — Progress Notes (Signed)
Hematology/Oncology Consultation   Name: Kathryn Rogers      MRN: [id number]    Location: Room/bed info not found  Date: 03/09/2022 Time:9:21 AM   REFERRING PHYSICIAN: Samuel Bouche, NP  REASON FOR CONSULT: Iron deficiency anemia    DIAGNOSIS: Iron deficiency anemia   HISTORY OF PRESENT ILLNESS: [name] is a very pleasant 61 yo caucasian female with history of anemia since at least 2016. She had moved away to [place] for a few years but has since moved back and gotten re-established with primary care.  On recent routine lab work, she was found to be severely iron deficient with iron saturation of 7% and ferritin 3. Hgb is now 10.5, MCV 74, platelets 347.  She has not noted any obvious blood loss. No bruising or petechiae.  She is symptomatic with fatigue, weakness and chest tightness.  No known family history of anemia.  She has chronic GI issues with frequent episodes of diarrhea, abdominal discomfort and bloating with nausea after eating. She will have up to 6 episodes of diarrhea at a time.   Her last colonoscopy was in September 2013. She was noted to have a few small mouthed diverticuli in the sigmoid colon.  She had her gallbladder removed in 1993.  She had a total hysterectomy at the age of 3 due to severe endometriosis.  She has 2 daughters and no history of miscarriage.  Mammogram in 2022 was negative, due again in October 2023.  She states that she has history of lung nodules and plans to follow-up with her PCP for routine imaging.  She is a type I diabetic after taking Macrobid on insulin.  No history of thyroid disease.  No smoking, ETOH or recreational drug use.  Appetite comes and goes. Hydration has been good. Her weight is stable at 180 lbs.   ROS: All other 10 point review of systems is negative.   PAST MEDICAL HISTORY:   Past Medical History:  Diagnosis Date   Diabetes mellitus without complication (HCC)    Diverticulosis    History of rheumatic fever    age 60    Hyperlipidemia    Hypertension    Migraine     ALLERGIES: Allergies  Allergen Reactions   Codeine Itching, Nausea And Vomiting and Other (See Comments)    Dizziness   Metoprolol Tartrate Other (See Comments)    Dull Headache      MEDICATIONS:  Current Outpatient Medications on File Prior to Visit  Medication Sig Dispense Refill   amLODipine (NORVASC) 10 MG tablet TAKE 1 TABLET DAILY 90 tablet 3   Ascorbic Acid (VITAMIN C PO) Take 1 tablet by mouth in the morning and at bedtime.     B-D ULTRAFINE III SHORT PEN 31G X 8 MM MISC USE 1 PEN NEEDLE INTO THE SKIN FOUR TIMES A DAY 360 each 3   blood glucose meter kit and supplies KIT Dispense based on patient and insurance preference. Use up to four times daily as directed. Please include lancets, test strips, control solution. 1 each 0   COD LIVER OIL PO Take 1 capsule by mouth daily.     Continuous Blood Gluc Receiver (FREESTYLE LIBRE 2 READER) DEVI 1 Device by Does not apply route daily. 1 each 11   Continuous Blood Gluc Sensor (FREESTYLE LIBRE 2 SENSOR) MISC 1 Device by Does not apply route daily. Change every 14 days 2 each 11   ferrous sulfate 325 (65 FE) MG EC tablet Take 1 tablet (325 mg  total) by mouth daily with breakfast. 90 tablet 3   fluticasone (FLONASE) 50 MCG/ACT nasal spray USE ONE SPRAY(S) IN EACH NOSTRIL ONCE DAILY AS NEEDED 48 g 1   glucose blood test strip Use as instructed to check blood sugar 4 times daily. (One Touch Ultra Blue Test) 400 each 3   HUMALOG KWIKPEN 100 UNIT/ML KwikPen INJECT 4 TO 8 UNITS UNDER THE SKIN THREE TIMES A DAY 15 mL 4   insulin glargine, 1 Unit Dial, (TOUJEO SOLOSTAR) 300 UNIT/ML Solostar Pen Inject 50 Units into the skin daily. 9 mL 4   JANUMET 50-1000 MG tablet TAKE 1 TABLET TWICE A DAY WITH MEALS 180 tablet 0   Lancets (ONETOUCH ULTRASOFT) lancets USE ONE LANCET TO CHECK GLUCOSE 4 TIMES DAILY 400 each 1   losartan (COZAAR) 50 MG tablet Take 1 tablet (50 mg total) by mouth daily. 90 tablet 0    meloxicam (MOBIC) 15 MG tablet TAKE ONE-HALF (1/2) TO ONE TABLET DAILY AS NEEDED FOR PAIN 30 tablet 11   metoprolol succinate (TOPROL-XL) 50 MG 24 hr tablet Take 1 tablet (50 mg total) by mouth daily. 90 tablet 3   Multiple Vitamins-Minerals (ZINC PO) Take 1 tablet by mouth daily.     ondansetron (ZOFRAN-ODT) 4 MG disintegrating tablet PLACE 1 TABLET ON THE TONGUE EVERY 8 HOURS AS NEEDED FOR NAUSEA OR VOMITING 20 tablet 51   pravastatin (PRAVACHOL) 20 MG tablet TAKE 1 TABLET DAILY 90 tablet 3   VITAMIN D PO Take 1 tablet by mouth in the morning and at bedtime.     amoxicillin (AMOXIL) 500 MG capsule Takes for dental procedures (Patient not taking: Reported on 03/09/2022)  5   No current facility-administered medications on file prior to visit.     PAST SURGICAL HISTORY Past Surgical History:  Procedure Laterality Date   ABDOMINAL HYSTERECTOMY  1991   CESAREAN SECTION     x 2   [place] SURGERY  2004   L5, discectomy and fusion    FAMILY HISTORY: Family History  Problem Relation Age of Onset   Hypertension Father    Alcohol abuse Paternal Grandfather    Cancer Cousin        breast   Cancer Paternal Aunt        breast    SOCIAL HISTORY:  reports that she has never smoked. She has never used smokeless tobacco. She reports that she does not drink alcohol and does not use drugs.  PERFORMANCE STATUS: The patient's performance status is 1 - Symptomatic but completely ambulatory  PHYSICAL EXAM: Most Recent Vital Signs: Blood pressure 123/70, pulse 78, temperature 98.2 F (36.8 C), temperature source Oral, resp. rate 18, height 4' 11"  (1.499 m), weight 180 lb 1.9 oz (81.7 kg), SpO2 100 %. BP 123/70 (BP Location: Right Arm, Patient Position: Sitting)   Pulse 78   Temp 98.2 F (36.8 C) (Oral)   Resp 18   Ht 4' 11"  (1.499 m)   Wt 180 lb 1.9 oz (81.7 kg)   SpO2 100%   BMI 36.38 kg/m   General Appearance:    Alert, cooperative, no distress, appears  stated age  Head:    Normocephalic, without obvious abnormality, atraumatic  Eyes:    PERRL, conjunctiva/corneas clear, EOM's intact, fundi    benign, both eyes        Throat:   Lips, mucosa, and tongue normal; teeth and gums normal  Neck:   Supple, symmetrical, trachea midline, no  adenopathy;    thyroid:  no enlargement/tenderness/nodules; no carotid   bruit or JVD  Back:     Symmetric, no curvature, ROM normal, no CVA tenderness  Lungs:     Clear to auscultation bilaterally, respirations unlabored  Chest Wall:    No tenderness or deformity   Heart:    Regular rate and rhythm, S1 and S2 normal, no murmur, rub   or gallop     Abdomen:     Soft, non-tender, bowel sounds active all four quadrants,    no masses, no organomegaly        Extremities:   Extremities normal, atraumatic, no cyanosis or edema  Pulses:   2+ and symmetric all extremities  Skin:   Skin color, texture, turgor normal, no rashes or lesions  Lymph nodes:   Cervical, supraclavicular, and axillary nodes normal  Neurologic:   CNII-XII intact, normal strength, sensation and reflexes    throughout    LABORATORY DATA:  Results for orders placed or performed in visit on 03/09/22 (from the past 48 hour(s))  Retic Panel     Status: Abnormal   Collection Time: 03/09/22  8:40 AM  Result Value Ref Range   Retic Ct Pct 1.2 0.4 - 3.1 %   RBC. 4.73 3.87 - 5.11 MIL/uL   Retic Count, Absolute 57.7 19.0 - 186.0 K/uL   Immature Retic Fract 30.4 (H) 2.3 - 15.9 %   Reticulocyte Hemoglobin 24.5 (L) >27.9 pg    Comment:        A RET-He < 28 pg is an indication of iron-deficient or iron- insufficient erythropoiesis. Patients with thalassemia may also have a decreased RET-He result unrelated to iron availability.     If this patient has chronic kidney disease and does not have a hemoglobinopathy he/she meets criteria for iron deficiency per the 2016 NICE guidelines. Refer to specific guidelines to determine the  appropriate thresholds for treating CKD- associated iron deficiency. TSAT and ferritin should be used in patients with hemoglobinopathies (e.g. thalassemia). Performed at [facility] at [facility], [address], [place], [place] [address]   CMP ([place] only)     Status: Abnormal   Collection Time: 03/09/22  8:40 AM  Result Value Ref Range   Sodium 142 135 - 145 mmol/L   Potassium 4.5 3.5 - 5.1 mmol/L   Chloride 105 98 - 111 mmol/L   CO2 29 22 - 32 mmol/L   Glucose, Bld 95 70 - 99 mg/dL    Comment: Glucose reference range applies only to samples taken after fasting for at least 8 hours.   BUN 14 6 - 20 mg/dL   Creatinine 0.81 0.44 - 1.00 mg/dL   Calcium 9.6 8.9 - 10.3 mg/dL   Total Protein 7.0 6.5 - 8.1 g/dL   Albumin 4.4 3.5 - 5.0 g/dL   AST 14 (L) 15 - 41 U/L   ALT 14 0 - 44 U/L   Alkaline Phosphatase 54 38 - 126 U/L   Total Bilirubin 0.3 0.3 - 1.2 mg/dL   GFR, Estimated >60 >60 mL/min    Comment: (NOTE) Calculated using the CKD-EPI Creatinine Equation (2021)    Anion gap 8 5 - 15    Comment: Performed at [facility] at [facility], [address], [place], [place] [address]  CBC with Differential ([facility] Center Only)     Status: Abnormal   Collection Time: 03/09/22  8:40 AM  Result  Value Ref Range   WBC Count 8.4 4.0 - 10.5 K/uL   RBC 4.84 3.87 - 5.11 MIL/uL   Hemoglobin 10.5 (L) 12.0 - 15.0 g/dL    Comment: Reticulocyte Hemoglobin testing may be clinically indicated, consider ordering this additional test [id number]    HCT 35.8 (L) 36.0 - 46.0 %   MCV 74.0 (L) 80.0 - 100.0 fL   MCH 21.7 (L) 26.0 - 34.0 pg   MCHC 29.3 (L) 30.0 - 36.0 g/dL   RDW 16.1 (H) 11.5 - 15.5 %   Platelet Count 347 150 - 400 K/uL   nRBC 0.0 0.0 - 0.2 %   Neutrophils Relative % 58 %   Neutro Abs 5.0 1.7 - 7.7 K/uL   Lymphocytes Relative 23 %   Lymphs Abs 1.9 0.7 - 4.0 K/uL   Monocytes Relative 5 %   Monocytes Absolute 0.4 0.1  - 1.0 K/uL   Eosinophils Relative 12 %   Eosinophils Absolute 1.0 (H) 0.0 - 0.5 K/uL   Basophils Relative 1 %   Basophils Absolute 0.0 0.0 - 0.1 K/uL   Immature Granulocytes 1 %   Abs Immature Granulocytes 0.07 0.00 - 0.07 K/uL    Comment: Performed at [facility] at [facility], [address], [place], [place] [address]      RADIOGRAPHY: No results found.     PATHOLOGY: None   ASSESSMENT/PLAN: Ms. Bolding is a very pleasant 61 yo caucasian female with history of anemia since at least 2016.  We will get her set up for 3 doses of IV iron.  Epo level and thalassemia work up are pending.  We will get a stool specimen from her to test for occult blood.  GI referred placed.  Follow-up in 6 weeks.   All questions were answered. The patient knows to call the clinic with any problems, questions or concerns. We can certainly see the patient much sooner if necessary.   Lottie Dawson, NP

## 2022-03-12 LAB — HGB FRACTIONATION CASCADE
Hgb A2: 1.9 % (ref 1.8–3.2)
Hgb A: 98.1 % (ref 96.4–98.8)
Hgb F: 0 % (ref 0.0–2.0)
Hgb S: 0 %

## 2022-03-13 ENCOUNTER — Telehealth: Payer: Self-pay | Admitting: *Deleted

## 2022-03-13 LAB — ERYTHROPOIETIN: Erythropoietin: 57.2 m[IU]/mL — ABNORMAL HIGH (ref 2.6–18.5)

## 2022-03-13 NOTE — Telephone Encounter (Signed)
Per 03/09/22 los - called and gave upcoming appointments - confirmed

## 2022-03-14 ENCOUNTER — Other Ambulatory Visit: Payer: Self-pay | Admitting: Family

## 2022-03-14 ENCOUNTER — Inpatient Hospital Stay: Payer: BC Managed Care – PPO

## 2022-03-14 VITALS — BP 126/80 | HR 82 | Resp 18

## 2022-03-14 DIAGNOSIS — K921 Melena: Secondary | ICD-10-CM

## 2022-03-14 DIAGNOSIS — D509 Iron deficiency anemia, unspecified: Secondary | ICD-10-CM

## 2022-03-14 DIAGNOSIS — Z9049 Acquired absence of other specified parts of digestive tract: Secondary | ICD-10-CM | POA: Diagnosis not present

## 2022-03-14 DIAGNOSIS — Z79899 Other long term (current) drug therapy: Secondary | ICD-10-CM | POA: Diagnosis not present

## 2022-03-14 DIAGNOSIS — E108 Type 1 diabetes mellitus with unspecified complications: Secondary | ICD-10-CM | POA: Diagnosis not present

## 2022-03-14 DIAGNOSIS — Z794 Long term (current) use of insulin: Secondary | ICD-10-CM | POA: Diagnosis not present

## 2022-03-14 DIAGNOSIS — Z803 Family history of malignant neoplasm of breast: Secondary | ICD-10-CM | POA: Diagnosis not present

## 2022-03-14 DIAGNOSIS — D649 Anemia, unspecified: Secondary | ICD-10-CM

## 2022-03-14 LAB — OCCULT BLOOD X 1 CARD TO LAB, STOOL
Fecal Occult Bld: NEGATIVE
Fecal Occult Bld: NEGATIVE

## 2022-03-14 MED ORDER — SODIUM CHLORIDE 0.9 % IV SOLN: Freq: Once | INTRAVENOUS | Status: AC

## 2022-03-14 MED ORDER — SODIUM CHLORIDE 0.9 % IV SOLN
300.0000 mg | Freq: Once | INTRAVENOUS | Status: AC
  Administered 2022-03-14: 300 mg via INTRAVENOUS
  Filled 2022-03-14: qty 200

## 2022-03-14 NOTE — Patient Instructions (Signed)

## 2022-03-19 ENCOUNTER — Other Ambulatory Visit: Payer: Self-pay | Admitting: Medical-Surgical

## 2022-03-20 IMAGING — MG MM DIGITAL SCREENING BILAT W/ TOMO AND CAD
8 series · 8 of 24 positions shown · non-contrast
Comparison: Previous exam(s).

CLINICAL DATA: Screening.

EXAM:
DIGITAL SCREENING BILATERAL MAMMOGRAM WITH TOMOSYNTHESIS AND CAD
TECHNIQUE: Bilateral screening digital craniocaudal and mediolateral oblique
mammograms were obtained. Bilateral screening digital breast
tomosynthesis was performed. The images were evaluated with
computer-aided detection.

[L MLO synth-2D]
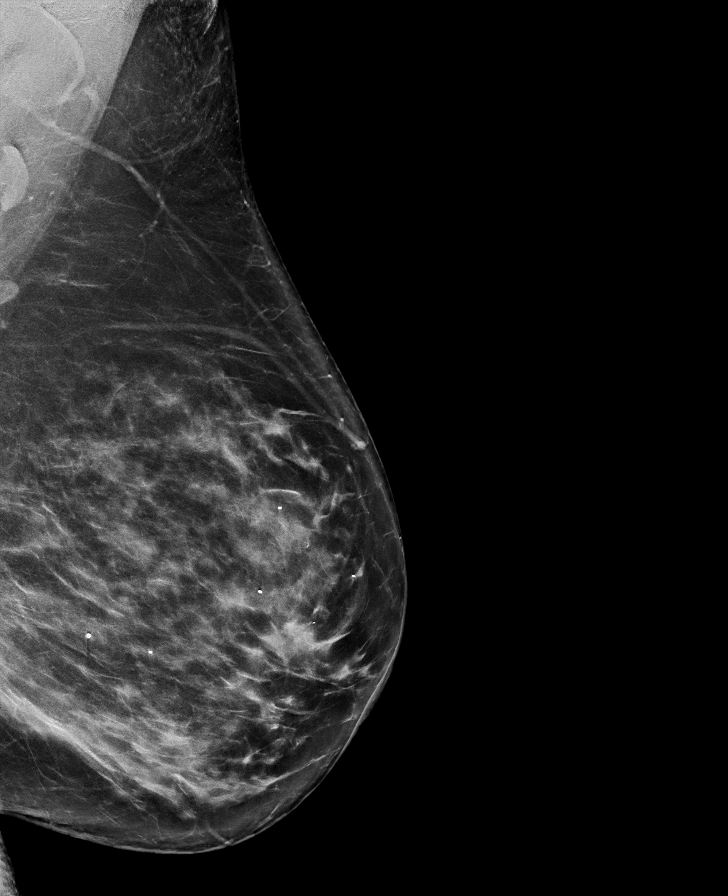

[L CC synth-2D]
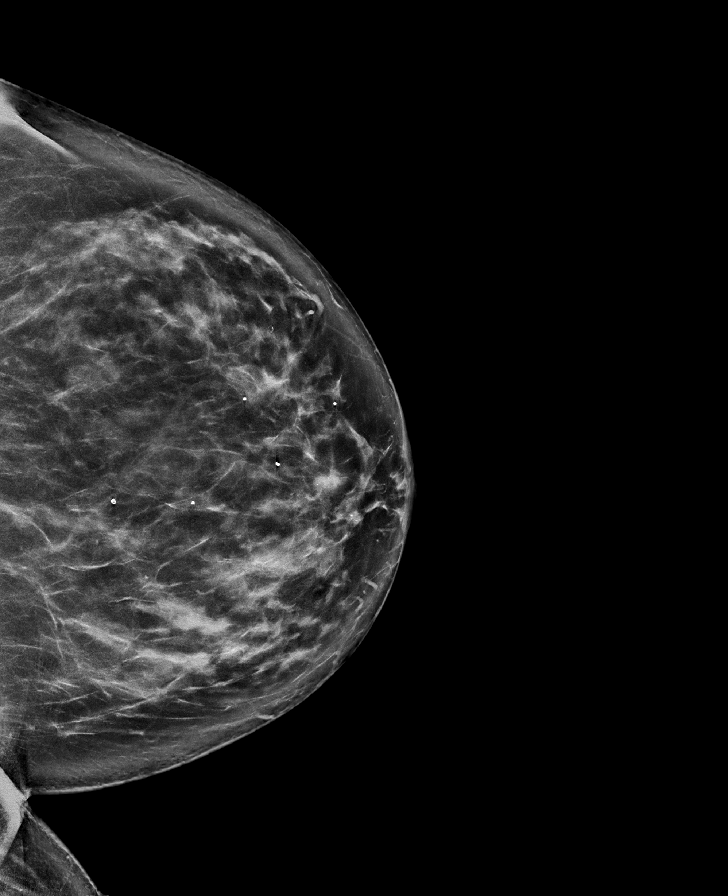

[R MLO synth-2D]
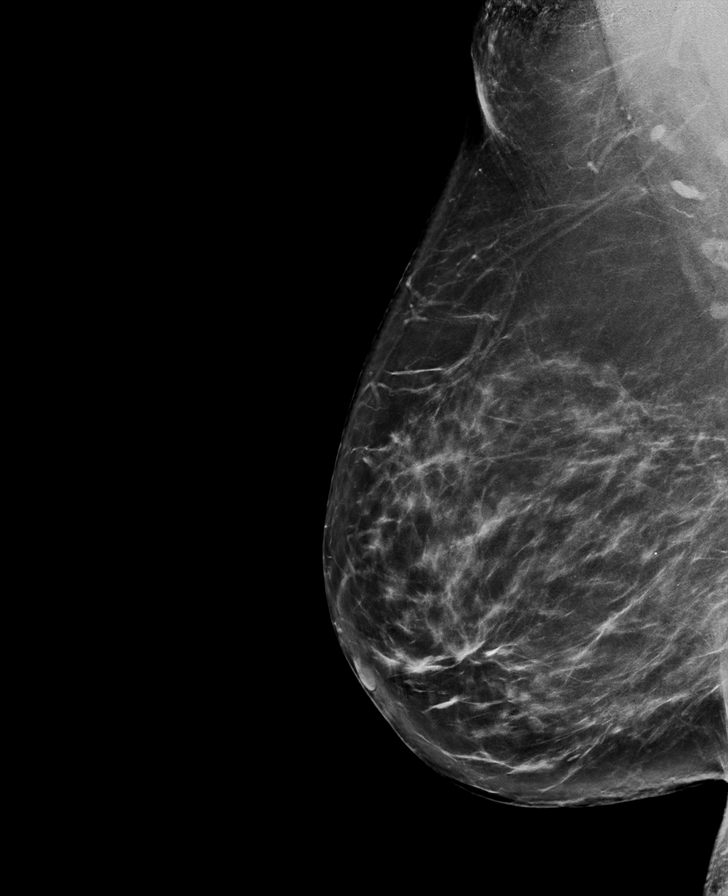

[R CC synth-2D]
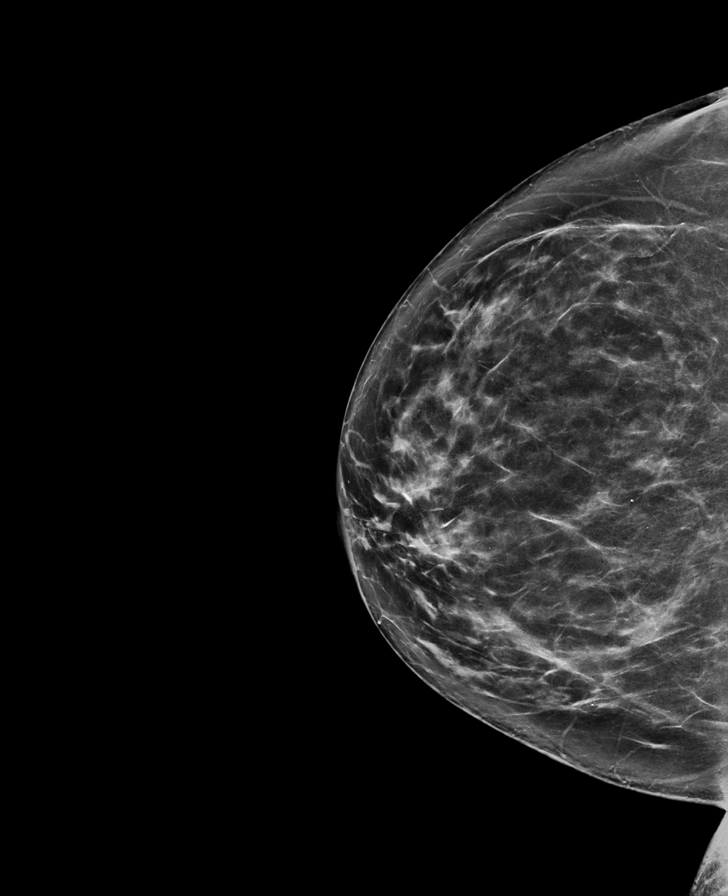

[R MLO tomo · tomo slice 47/92.0]
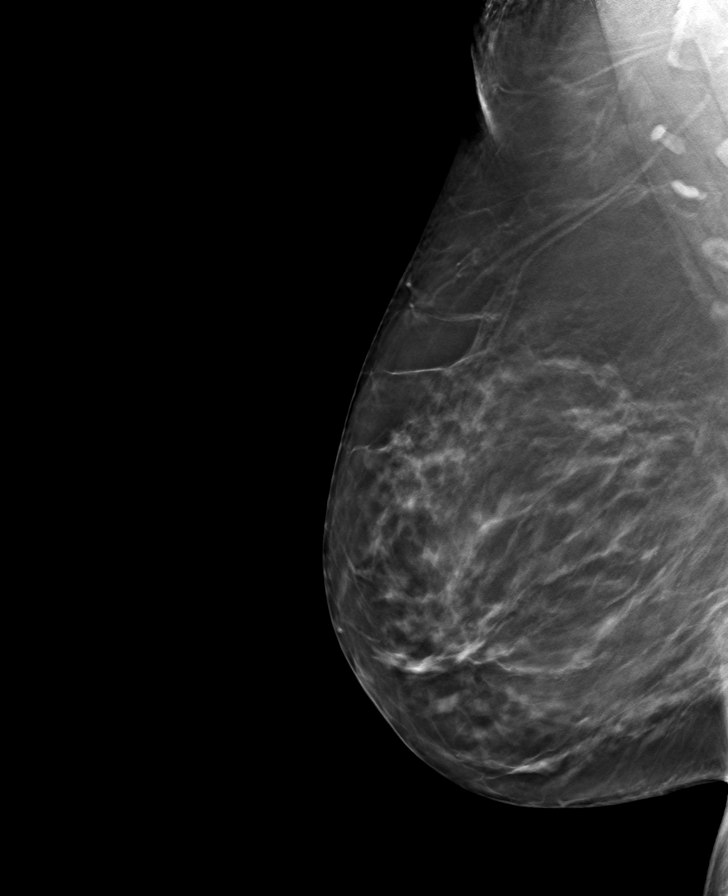

[L MLO tomo · tomo slice 47/94.0]
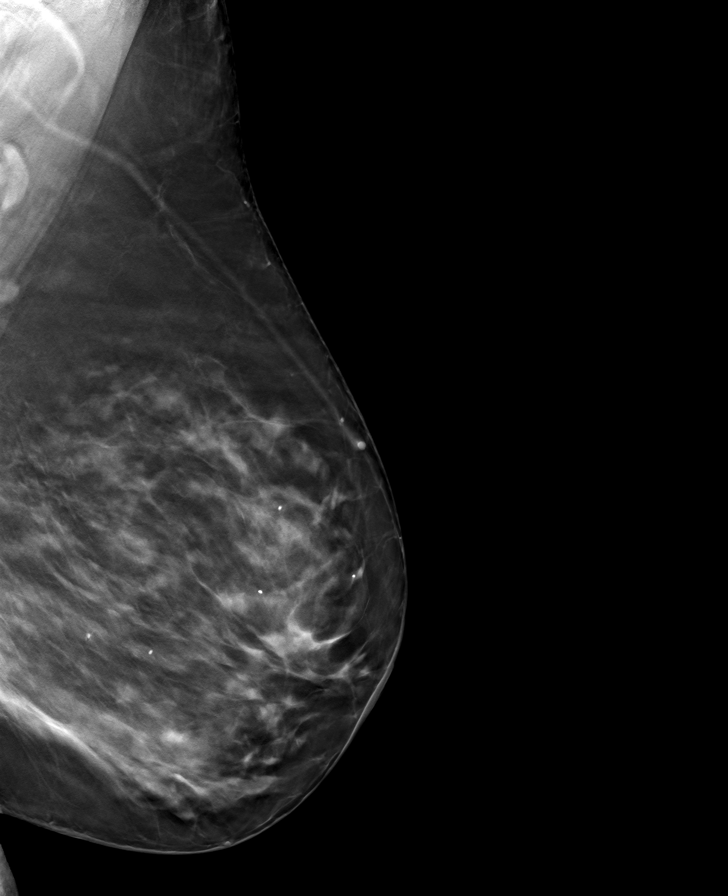

[R CC tomo · tomo slice 42/83.0]
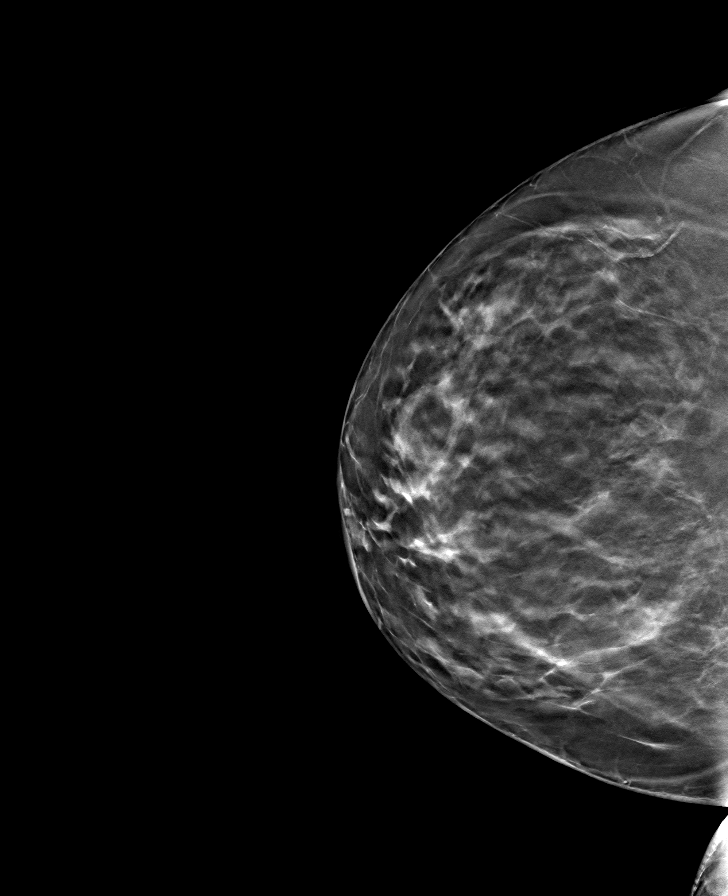

[L CC tomo · tomo slice 45/90.0]
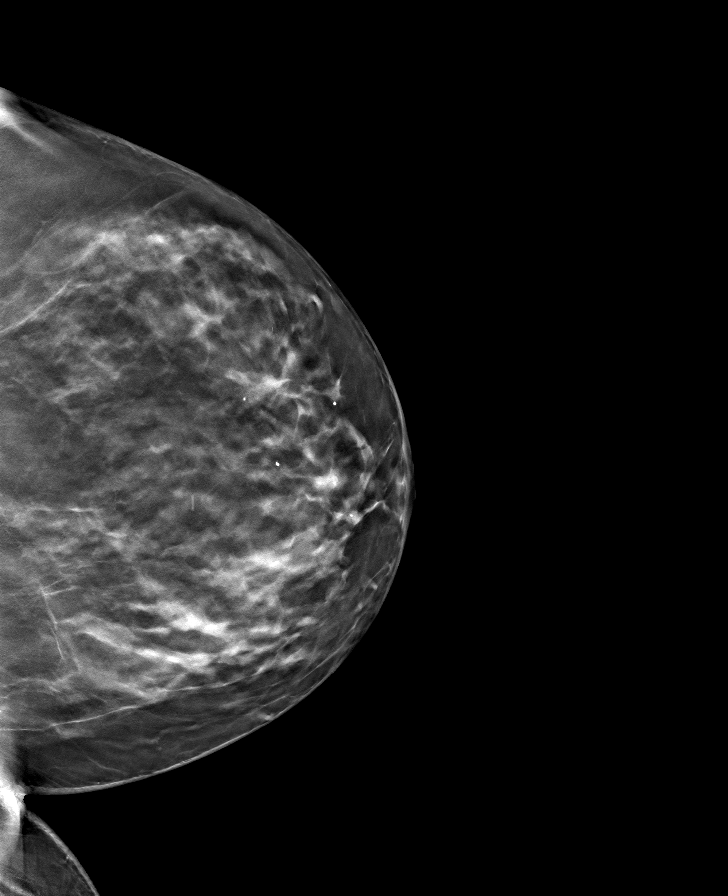

[8 of 24 positions shown; findings below may reference images not displayed]

ACR Breast Density Category c: The breast tissue is heterogeneously
dense, which may obscure small masses.
FINDINGS: There are no findings suspicious for malignancy.
IMPRESSION: No mammographic evidence of malignancy. A result letter of this
screening mammogram will be mailed directly to the patient.

RECOMMENDATION:
Screening mammogram in one year. (Code:Q3-W-BC3)

BI-RADS CATEGORY  1: Negative.

## 2022-03-21 ENCOUNTER — Inpatient Hospital Stay: Payer: BC Managed Care – PPO | Attending: Hematology & Oncology

## 2022-03-21 ENCOUNTER — Other Ambulatory Visit: Payer: Self-pay | Admitting: Medical-Surgical

## 2022-03-21 VITALS — BP 132/61 | HR 80 | Temp 97.6°F | Resp 20

## 2022-03-21 DIAGNOSIS — D509 Iron deficiency anemia, unspecified: Secondary | ICD-10-CM | POA: Diagnosis not present

## 2022-03-21 MED ORDER — SODIUM CHLORIDE 0.9 % IV SOLN
300.0000 mg | Freq: Once | INTRAVENOUS | Status: AC
  Administered 2022-03-21: 300 mg via INTRAVENOUS
  Filled 2022-03-21: qty 300

## 2022-03-21 MED ORDER — SODIUM CHLORIDE 0.9 % IV SOLN: Freq: Once | INTRAVENOUS | Status: AC

## 2022-03-21 MED ORDER — LOSARTAN POTASSIUM 50 MG PO TABS: 50.0000 mg | ORAL_TABLET | Freq: Every day | ORAL | 0 refills | Status: DC

## 2022-03-21 NOTE — Patient Instructions (Signed)

## 2022-03-28 ENCOUNTER — Inpatient Hospital Stay: Payer: BC Managed Care – PPO

## 2022-03-28 VITALS — BP 153/84 | HR 70 | Temp 98.8°F | Resp 17

## 2022-03-28 DIAGNOSIS — D509 Iron deficiency anemia, unspecified: Secondary | ICD-10-CM | POA: Diagnosis not present

## 2022-03-28 MED ORDER — SODIUM CHLORIDE 0.9 % IV SOLN
300.0000 mg | Freq: Once | INTRAVENOUS | Status: AC
  Administered 2022-03-28: 300 mg via INTRAVENOUS
  Filled 2022-03-28: qty 15

## 2022-03-28 MED ORDER — SODIUM CHLORIDE 0.9 % IV SOLN: Freq: Once | INTRAVENOUS | Status: AC

## 2022-03-28 NOTE — Patient Instructions (Signed)

## 2022-04-02 DIAGNOSIS — H43393 Other vitreous opacities, bilateral: Secondary | ICD-10-CM | POA: Diagnosis not present

## 2022-04-02 DIAGNOSIS — E109 Type 1 diabetes mellitus without complications: Secondary | ICD-10-CM | POA: Diagnosis not present

## 2022-04-02 DIAGNOSIS — Z794 Long term (current) use of insulin: Secondary | ICD-10-CM | POA: Diagnosis not present

## 2022-04-02 DIAGNOSIS — H25013 Cortical age-related cataract, bilateral: Secondary | ICD-10-CM | POA: Diagnosis not present

## 2022-04-10 ENCOUNTER — Ambulatory Visit: Payer: BC Managed Care – PPO | Admitting: Sports Medicine

## 2022-04-10 ENCOUNTER — Ambulatory Visit (INDEPENDENT_AMBULATORY_CARE_PROVIDER_SITE_OTHER): Payer: BC Managed Care – PPO

## 2022-04-10 ENCOUNTER — Encounter: Payer: Self-pay | Admitting: Sports Medicine

## 2022-04-10 DIAGNOSIS — M1711 Unilateral primary osteoarthritis, right knee: Secondary | ICD-10-CM

## 2022-04-10 DIAGNOSIS — G8929 Other chronic pain: Secondary | ICD-10-CM

## 2022-04-10 DIAGNOSIS — Z09 Encounter for follow-up examination after completed treatment for conditions other than malignant neoplasm: Secondary | ICD-10-CM

## 2022-04-10 DIAGNOSIS — M25561 Pain in right knee: Secondary | ICD-10-CM

## 2022-04-10 DIAGNOSIS — M170 Bilateral primary osteoarthritis of knee: Secondary | ICD-10-CM | POA: Diagnosis not present

## 2022-04-10 MED ORDER — CELECOXIB 200 MG PO CAPS: ORAL_CAPSULE | ORAL | 2 refills | Status: DC

## 2022-04-10 NOTE — Assessment & Plan Note (Signed)
Pleasant 61 year old female, history of type 1 diabetes, well controlled with increasing pain right knee medial joint line with moderate swelling. Moderate gelling. No trauma, no mechanical symptoms, on exam she has tenderness medial joint line, she has an effusion, negative Homans' sign. Adding x-rays, Celebrex (she was getting dyspepsia with ibuprofen), home conditioning, return to see me in 2 to 6 weeks, injection if not better.

## 2022-04-10 NOTE — Addendum Note (Signed)
Addended by: Carolin Coy on: 04/10/2022 03:23 PM   Modules accepted: Orders

## 2022-04-10 NOTE — Progress Notes (Signed)
    Procedures performed today:    None.  Independent interpretation of notes and tests performed by another provider:   None.  Brief History, Exam, Impression, and Recommendations:    Primary osteoarthritis of right knee Pleasant 61 year old female, history of type 1 diabetes, well controlled with increasing pain right knee medial joint line with moderate swelling. Moderate gelling. No trauma, no mechanical symptoms, on exam she has tenderness medial joint line, she has an effusion, negative Homans' sign. Adding x-rays, Celebrex (she was getting dyspepsia with ibuprofen), home conditioning, return to see me in 2 to 6 weeks, injection if not better.  Chronic process with exacerbation and pharmacologic intervention  ____________________________________________ Ihor Austin. Benjamin Stain, M.D., ABFM., CAQSM., AME. Primary Care and Sports Medicine [facility]  Adjunct Professor of Family Medicine  [employer]  Restaurant manager, fast food

## 2022-04-11 ENCOUNTER — Encounter: Payer: Self-pay | Admitting: Sports Medicine

## 2022-04-12 NOTE — Telephone Encounter (Signed)
[facility] called stating that the insurance plan is requesting use of other medication before the Celecoxib will be considered unless there are special circumstances.  The preferred covered medications are Naproxen or Meloxicam   Contact [facility] at [phone] or E-Scribe a covered Rx to [facility]  Reference # [id number]

## 2022-04-19 ENCOUNTER — Ambulatory Visit: Payer: BC Managed Care – PPO | Admitting: Medical-Surgical

## 2022-04-19 ENCOUNTER — Encounter: Payer: Self-pay | Admitting: Internal Medicine

## 2022-04-20 ENCOUNTER — Inpatient Hospital Stay: Payer: BC Managed Care – PPO | Attending: Hematology & Oncology

## 2022-04-20 ENCOUNTER — Encounter: Payer: Self-pay | Admitting: Family

## 2022-04-20 ENCOUNTER — Inpatient Hospital Stay: Payer: BC Managed Care – PPO | Admitting: Family

## 2022-04-20 ENCOUNTER — Encounter: Payer: Self-pay | Admitting: Internal Medicine

## 2022-04-20 ENCOUNTER — Telehealth: Payer: Self-pay | Admitting: *Deleted

## 2022-04-20 VITALS — BP 148/92 | HR 85 | Temp 98.1°F | Resp 17 | Wt 181.0 lb

## 2022-04-20 DIAGNOSIS — D509 Iron deficiency anemia, unspecified: Secondary | ICD-10-CM | POA: Insufficient documentation

## 2022-04-20 DIAGNOSIS — D649 Anemia, unspecified: Secondary | ICD-10-CM

## 2022-04-20 LAB — CBC WITH DIFFERENTIAL (CANCER CENTER ONLY)
Abs Immature Granulocytes: 0.07 10*3/uL (ref 0.00–0.07)
Basophils Absolute: 0 10*3/uL (ref 0.0–0.1)
Basophils Relative: 1 %
Eosinophils Absolute: 0.6 10*3/uL — ABNORMAL HIGH (ref 0.0–0.5)
Eosinophils Relative: 8 %
HCT: 38.2 % (ref 36.0–46.0)
Hemoglobin: 11.8 g/dL — ABNORMAL LOW (ref 12.0–15.0)
Immature Granulocytes: 1 %
Lymphocytes Relative: 25 %
Lymphs Abs: 1.9 10*3/uL (ref 0.7–4.0)
MCH: 24.6 pg — ABNORMAL LOW (ref 26.0–34.0)
MCHC: 30.9 g/dL (ref 30.0–36.0)
MCV: 79.6 fL — ABNORMAL LOW (ref 80.0–100.0)
Monocytes Absolute: 0.4 10*3/uL (ref 0.1–1.0)
Monocytes Relative: 5 %
Neutro Abs: 4.8 10*3/uL (ref 1.7–7.7)
Neutrophils Relative %: 60 %
Platelet Count: 320 10*3/uL (ref 150–400)
RBC: 4.8 MIL/uL (ref 3.87–5.11)
RDW: 19.9 % — ABNORMAL HIGH (ref 11.5–15.5)
WBC Count: 7.8 10*3/uL (ref 4.0–10.5)
nRBC: 0 % (ref 0.0–0.2)

## 2022-04-20 LAB — RETICULOCYTES
Immature Retic Fract: 13.1 % (ref 2.3–15.9)
RBC.: 4.83 MIL/uL (ref 3.87–5.11)
Retic Count, Absolute: 55.5 10*3/uL (ref 19.0–186.0)
Retic Ct Pct: 1.2 % (ref 0.4–3.1)

## 2022-04-20 LAB — IRON AND IRON BINDING CAPACITY (CC-WL,HP ONLY)
Iron: 51 ug/dL (ref 28–170)
Saturation Ratios: 17 % (ref 10.4–31.8)
TIBC: 309 ug/dL (ref 250–450)
UIBC: 258 ug/dL (ref 148–442)

## 2022-04-20 LAB — FERRITIN: Ferritin: 71 ng/mL (ref 11–307)

## 2022-04-20 NOTE — Progress Notes (Signed)
Hematology and Oncology Follow Up Visit  Kathryn Rogers [id number] [birth year] 61 y.o. 04/20/2022   Principle Diagnosis:  Iron deficiency anemia   Current Therapy:   IV iron as indicated    Interim History:  Kathryn Rogers is here today for follow-up. She is feeling much better. No complaints at this time.  She has not noted any blood loss. No bruising or petechiae.  She is scheduled for evaluation with GI in September and scope in October.  No fever, chills, n/v, cough, rash, dizziness, SOB, chest pain, palpitations, abdominal pain or changes in bowel or bladder habits.  No swelling, tenderness, numbness or tingling in her extremities.  No falls or syncope reported.  Appetite and hydration are good. Weight is stable at 181 lbs.   ECOG Performance Status: 1 - Symptomatic but completely ambulatory  Medications:  Allergies as of 04/20/2022       Reactions   Codeine Itching, Nausea And Vomiting, Other (See Comments)   Dizziness   Metoprolol Tartrate Other (See Comments)   Dull Headache        Medication List        Accurate as of April 20, 2022  8:45 AM. If you have any questions, ask your nurse or doctor.          amLODipine 10 MG tablet Commonly known as: NORVASC TAKE 1 TABLET DAILY   amoxicillin 500 MG capsule Commonly known as: AMOXIL Takes for dental procedures   B-D ULTRAFINE III SHORT PEN 31G X 8 MM Misc Generic drug: Insulin Pen Needle USE 1 PEN NEEDLE INTO THE SKIN FOUR TIMES A DAY   blood glucose meter kit and supplies Kit Dispense based on patient and insurance preference. Use up to four times daily as directed. Please include lancets, test strips, control solution.   celecoxib 200 MG capsule Commonly known as: CeleBREX One to 2 tablets by mouth daily as needed for pain.   COD LIVER OIL PO Take 1 capsule by mouth daily.   ferrous sulfate 325 (65 FE) MG EC tablet Take 1 tablet (325 mg total) by mouth daily with breakfast.   fluticasone 50 MCG/ACT  nasal spray Commonly known as: FLONASE USE ONE SPRAY(S) IN EACH NOSTRIL ONCE DAILY AS NEEDED   FreeStyle Libre 2 Reader Devi 1 Device by Does not apply route daily.   FreeStyle Libre 2 Sensor Misc 1 Device by Does not apply route daily. Change every 14 days   glucose blood test strip Use as instructed to check blood sugar 4 times daily. (One Touch Ultra Blue Test)   HumaLOG KwikPen 100 UNIT/ML KwikPen Generic drug: insulin lispro INJECT 4 TO 8 UNITS UNDER THE SKIN THREE TIMES A DAY   Janumet 50-1000 MG tablet Generic drug: sitaGLIPtin-metformin TAKE 1 TABLET TWICE A DAY WITH MEALS   losartan 50 MG tablet Commonly known as: COZAAR Take 1 tablet (50 mg total) by mouth daily.   metoprolol succinate 50 MG 24 hr tablet Commonly known as: TOPROL-XL Take 1 tablet (50 mg total) by mouth daily.   ondansetron 4 MG disintegrating tablet Commonly known as: ZOFRAN-ODT PLACE 1 TABLET ON THE TONGUE EVERY 8 HOURS AS NEEDED FOR NAUSEA OR VOMITING   onetouch ultrasoft lancets USE ONE LANCET TO CHECK GLUCOSE 4 TIMES DAILY   pravastatin 20 MG tablet Commonly known as: PRAVACHOL TAKE 1 TABLET DAILY   Toujeo SoloStar 300 UNIT/ML Solostar Pen Generic drug: insulin glargine (1 Unit Dial) Inject 50 Units into the skin daily.   VITAMIN C  PO Take 1 tablet by mouth in the morning and at bedtime.   VITAMIN D PO Take 1 tablet by mouth in the morning and at bedtime.   ZINC PO Take 1 tablet by mouth daily.        Allergies:  Allergies  Allergen Reactions   Codeine Itching, Nausea And Vomiting and Other (See Comments)    Dizziness   Metoprolol Tartrate Other (See Comments)    Dull Headache    Past Medical History, Surgical history, Social history, and Family History were reviewed and updated.  Review of Systems: All other 10 point review of systems is negative.   Physical Exam:  weight is 181 lb (82.1 kg). Her oral temperature is 98.1 F (36.7 C). Her blood pressure is 148/92  (abnormal) and her pulse is 85. Her respiration is 17 and oxygen saturation is 100%.   Wt Readings from Last 3 Encounters:  04/20/22 181 lb (82.1 kg)  03/09/22 180 lb 1.9 oz (81.7 kg)  03/02/22 178 lb 2.1 oz (80.8 kg)    Ocular: Sclerae unicteric, pupils equal, round and reactive to light Ear-nose-throat: Oropharynx clear, dentition fair Lymphatic: No cervical or supraclavicular adenopathy Lungs no rales or rhonchi, good excursion bilaterally Heart regular rate and rhythm, no murmur appreciated Abd soft, nontender, positive bowel sounds MSK no focal spinal tenderness, no joint edema Neuro: non-focal, well-oriented, appropriate affect Breasts: Deferred   Lab Results  Component Value Date   WBC 7.8 04/20/2022   HGB 11.8 (L) 04/20/2022   HCT 38.2 04/20/2022   MCV 79.6 (L) 04/20/2022   PLT 320 04/20/2022   Lab Results  Component Value Date   FERRITIN 3 (L) 03/02/2022   IRON 33 (L) 03/02/2022   TIBC 459 (H) 03/02/2022   IRONPCTSAT 7 (L) 03/02/2022   Lab Results  Component Value Date   RETICCTPCT 1.2 04/20/2022   RBC 4.83 04/20/2022   No results found for: "KPAFRELGTCHN", "LAMBDASER", "KAPLAMBRATIO" No results found for: "IGGSERUM", "IGA", "IGMSERUM" No results found for: "TOTALPROTELP", "ALBUMINELP", "A1GS", "A2GS", "BETS", "BETA2SER", "GAMS", "MSPIKE", "SPEI"   Chemistry      Component Value Date/Time   NA 142 03/09/2022 0840   K 4.5 03/09/2022 0840   CL 105 03/09/2022 0840   CO2 29 03/09/2022 0840   BUN 14 03/09/2022 0840   CREATININE 0.81 03/09/2022 0840   CREATININE 0.79 03/02/2022 0841      Component Value Date/Time   CALCIUM 9.6 03/09/2022 0840   ALKPHOS 54 03/09/2022 0840   AST 14 (L) 03/09/2022 0840   ALT 14 03/09/2022 0840   BILITOT 0.3 03/09/2022 0840       Impression and Plan: Kathryn Rogers is a very pleasant 61 yo caucasian female with history of anemia since at least 2016.  Iron studies pending.  Follow-up in 3 months.   Lottie Dawson,  NP 9/1/20238:45 AM

## 2022-04-20 NOTE — Telephone Encounter (Signed)
Per 04/20/22 los - gave upcoming appointments - confirmed 

## 2022-04-23 ENCOUNTER — Other Ambulatory Visit: Payer: Self-pay | Admitting: Medical-Surgical

## 2022-04-26 ENCOUNTER — Other Ambulatory Visit: Payer: Self-pay | Admitting: Medical-Surgical

## 2022-05-30 ENCOUNTER — Encounter: Payer: BC Managed Care – PPO | Admitting: Internal Medicine

## 2022-05-31 ENCOUNTER — Ambulatory Visit (INDEPENDENT_AMBULATORY_CARE_PROVIDER_SITE_OTHER): Payer: BC Managed Care – PPO

## 2022-05-31 DIAGNOSIS — Z1231 Encounter for screening mammogram for malignant neoplasm of breast: Secondary | ICD-10-CM

## 2022-06-07 ENCOUNTER — Other Ambulatory Visit: Payer: Self-pay | Admitting: Medical-Surgical

## 2022-06-07 ENCOUNTER — Encounter: Payer: Self-pay | Admitting: Medical-Surgical

## 2022-06-07 ENCOUNTER — Ambulatory Visit: Payer: BC Managed Care – PPO | Admitting: Medical-Surgical

## 2022-06-07 VITALS — BP 119/72 | HR 78 | Resp 20 | Ht 59.0 in | Wt 178.0 lb

## 2022-06-07 DIAGNOSIS — E785 Hyperlipidemia, unspecified: Secondary | ICD-10-CM | POA: Diagnosis not present

## 2022-06-07 DIAGNOSIS — E1065 Type 1 diabetes mellitus with hyperglycemia: Secondary | ICD-10-CM | POA: Diagnosis not present

## 2022-06-07 DIAGNOSIS — I10 Essential (primary) hypertension: Secondary | ICD-10-CM

## 2022-06-07 LAB — POCT GLYCOSYLATED HEMOGLOBIN (HGB A1C): HbA1c, POC (controlled diabetic range): 7.1 % — AB (ref 0.0–7.0)

## 2022-06-07 NOTE — Progress Notes (Signed)
Established Patient Office Visit  Subjective   Patient ID: Kathryn Rogers, female    DOB: 1961/01/20  Age: 61 y.o. MRN: [id number]  Chief Complaint  Patient presents with   Diabetes   Follow-up    Diabetes    [name] is here today for routine follow up on the following chronic diseases:   Hypertension: Medication: amlodipine 10 mg, daily. Losartan 50 mg daily and metoprolol succinate 50 mg 24 hr tab, daily.  Compliant: takes medication as prescribed. Side effects: denies any dizziness, chest pain and or palpitations.  Checking BP at home: occasionally but not recently. Low sodium diet: she reports eating well,  Exercise: she reports going for walks 20-30 mins, multiple times daily.  Concerning symptoms: none,   Diabetes Type: late onset, type I Medications: Humalog dosing via sliding scale before meals and Glargine 50 units subcutaneous, once daily.   Compliant: She reports that she takes her medication as schedule. Side effects: none mentioned, Diabetic diet: She reports consuming appropriate food intake to help manage control her sugar.  Complications:  She reports some episodes of low blood sugar around early morning, she sweats and feels cold and hot.  Checking sugars: Before meals, sometimes an hour after, readings 101-110 before, and around 180-190 an hour after meals.  Eye exam: Last completed 08/14/ 2023 Foot exam: today.  Microalbumin screening: current, last done 03/02/2022 Statin: on pravastatin 20 mg daily.  ACE/ARB: Losartan 50 mg, daily.  Last A1c: today, 7.1  Hyperlipidemia Medication: pravastatin 20 mg, daily.  Side effects: denies any lower muscular discomfort  Low fat diet:reports consuming good fats and avoiding unsaturated and trans fat.   Review of Systems  Constitutional:        She reports episodes of sweats along w/ cold and heat when blood sugar goes down less than 50 early morning   HENT: Negative.    Respiratory: Negative.    Cardiovascular:  Negative.   Musculoskeletal: Negative.   Neurological: Negative.   Psychiatric/Behavioral: Negative.        Objective:     BP 119/72 (BP Location: Left Arm, Cuff Size: Normal)   Pulse 78   Resp 20   Ht 4\' 11"  (1.499 m)   Wt 80.7 kg   SpO2 97%   BMI 35.95 kg/m    Physical Exam Constitutional:      General: She is not in acute distress.    Appearance: Normal appearance. She is not ill-appearing, toxic-appearing or diaphoretic.  HENT:     Head: Normocephalic and atraumatic.  Eyes:     Conjunctiva/sclera: Conjunctivae normal.  Cardiovascular:     Rate and Rhythm: Normal rate and regular rhythm.     Pulses: Normal pulses.     Heart sounds: Normal heart sounds.  Pulmonary:     Effort: Pulmonary effort is normal.     Breath sounds: Normal breath sounds.  Skin:    General: Skin is warm and dry.  Neurological:     General: No focal deficit present.     Mental Status: She is alert and oriented to person, place, and time. Mental status is at baseline.  Psychiatric:        Mood and Affect: Mood normal.        Behavior: Behavior normal.        Thought Content: Thought content normal.        Judgment: Judgment normal.      Results for orders placed or performed in visit on 06/07/22  POCT HgB  A1C  Result Value Ref Range   Hemoglobin A1C     HbA1c POC (<> result, manual entry)     HbA1c, POC (prediabetic range)     HbA1c, POC (controlled diabetic range) 7.1 (A) 0.0 - 7.0 %      The 10-year ASCVD risk score (Arnett DK, et al., 2019) is: 7.3%    Assessment & Plan:   1. Primary hypertension She is doing well managing her blood pressure and we will continue with prescribed medications of Amlodipine 10 mg daily along with losartan 50 mg and metoprolol succinate 50 mg 24 hr tablet, both daily as well. We recommended checking her blood pressure at home goals of 130/80 mmHg or less. Continue with activity as tolerated and low sodium diet.    2. Type 1 diabetes mellitus  with hyperglycemia (HCC) Her A1C has improved today. She will benefit using a continuous blood glucose monitoring such as Freestyle, we sent a prescription for this. We also instructed her to eat a small snack at bedtime to offset early morning drops in her blood sugar levels.  We discussed the importance of eating snacks such as low sugar peanut butter and other nuts that can help stabilizes her sugar longer without spiking it too high.   - POCT HgB A1C  3. Hyperlipidemia LDL goal <70 Continue taking pravastatin 20 mg daily and avoid processed foods specially high in saturated and trans fat.     Return in about 6 months (around 12/07/2022) for DM/HTN/HLD follow up.    Loretha Stapler, RN Student NP

## 2022-06-07 NOTE — Progress Notes (Signed)
Medical screening examination/treatment was performed by qualified clinical staff member and as supervising provider I was immediately available for consultation/collaboration. I have reviewed documentation and agree with assessment and plan. ° °Catherene Kaleta L. Ritter Helsley, DNP, APRN, FNP-BC °[facility] °Primary Care and Sports Medicine ° °

## 2022-06-19 ENCOUNTER — Other Ambulatory Visit: Payer: Self-pay | Admitting: Medical-Surgical

## 2022-07-16 ENCOUNTER — Ambulatory Visit: Payer: BC Managed Care – PPO | Admitting: Internal Medicine

## 2022-07-20 ENCOUNTER — Other Ambulatory Visit: Payer: BC Managed Care – PPO

## 2022-07-20 ENCOUNTER — Ambulatory Visit: Payer: BC Managed Care – PPO | Admitting: Family

## 2022-07-30 ENCOUNTER — Encounter: Payer: Self-pay | Admitting: Medical-Surgical

## 2022-07-30 MED ORDER — ONETOUCH ULTRASOFT LANCETS MISC
1 refills | Status: DC
Start: 2022-07-30 — End: 2023-02-06

## 2022-07-30 NOTE — Telephone Encounter (Signed)
Rx written by a historical provider. Rx pended.

## 2022-08-13 ENCOUNTER — Other Ambulatory Visit: Payer: Self-pay | Admitting: Medical-Surgical

## 2022-08-13 DIAGNOSIS — J984 Other disorders of lung: Secondary | ICD-10-CM

## 2022-09-17 ENCOUNTER — Other Ambulatory Visit: Payer: Self-pay | Admitting: Medical-Surgical

## 2022-09-28 ENCOUNTER — Encounter: Payer: Self-pay | Admitting: Internal Medicine

## 2022-10-15 ENCOUNTER — Other Ambulatory Visit: Payer: Self-pay | Admitting: Medical-Surgical

## 2022-10-25 ENCOUNTER — Other Ambulatory Visit: Payer: Self-pay | Admitting: Medical-Surgical

## 2022-11-06 ENCOUNTER — Other Ambulatory Visit: Payer: Self-pay | Admitting: Medical-Surgical

## 2022-11-25 ENCOUNTER — Other Ambulatory Visit: Payer: Self-pay | Admitting: Medical-Surgical

## 2022-12-06 ENCOUNTER — Other Ambulatory Visit: Payer: Self-pay | Admitting: Medical-Surgical

## 2022-12-07 ENCOUNTER — Ambulatory Visit: Payer: BC Managed Care – PPO | Admitting: Medical-Surgical

## 2022-12-07 ENCOUNTER — Encounter: Payer: Self-pay | Admitting: Medical-Surgical

## 2022-12-07 VITALS — BP 134/80 | HR 75 | Resp 20 | Ht 59.0 in | Wt 175.7 lb

## 2022-12-07 DIAGNOSIS — E1065 Type 1 diabetes mellitus with hyperglycemia: Secondary | ICD-10-CM | POA: Diagnosis not present

## 2022-12-07 DIAGNOSIS — I10 Essential (primary) hypertension: Secondary | ICD-10-CM

## 2022-12-07 DIAGNOSIS — E785 Hyperlipidemia, unspecified: Secondary | ICD-10-CM | POA: Diagnosis not present

## 2022-12-07 LAB — POCT GLYCOSYLATED HEMOGLOBIN (HGB A1C)
HbA1c, POC (controlled diabetic range): 7.3 % — AB (ref 0.0–7.0)
Hemoglobin A1C: 7.3 % — AB (ref 4.0–5.6)

## 2022-12-07 MED ORDER — SUMATRIPTAN SUCCINATE 50 MG PO TABS: 50.0000 mg | ORAL_TABLET | ORAL | 11 refills | Status: DC | PRN

## 2022-12-07 MED ORDER — FREESTYLE LIBRE 3 SENSOR MISC: 3 refills | Status: AC

## 2022-12-07 MED ORDER — ONDANSETRON 4 MG PO TBDP: ORAL_TABLET | ORAL | 3 refills | Status: DC

## 2022-12-07 NOTE — Progress Notes (Signed)
        Established patient visit  History, exam, impression, and plan:  HTN (hypertension) Pleasant 62 year old female with a history of hypertension currently well-managed with amlodipine 10 mg daily, losartan 50 mg daily, and Toprol-XL 50 mg daily.  Tolerating all medications well without side effects.  Occasionally checks blood pressure at home.  Following a low-sodium diet.  Staying physically active and walking 7 days a week.  Denies concerning symptoms.  On exam, no peripheral edema, HRR, S1/S2 normal.  Lungs CTA, respirations even and unlabored.  Blood pressure is mildly elevated on arrival at 137/77 with recheck of 130/80.  Since this is at goal, no change in medications today.  Type 1 diabetes mellitus with hyperglycemia (HCC) Long history of management of type 1 diabetes.  She is currently on Janumet 50-1000 mg twice daily with meals.  Using Toujeo 51 units nightly.  Also using Humalog for carb counting and sliding scale.  Monitoring her sugars at least 3 times daily.  At night, reports that when she has an elevated reading, she will take a small amount of Humalog.  There are times she wakes up in the early morning hours and checks her sugar and has noted that at times it goes up to the 300s.  Has been working very hard over the last 6 months to follow a diabetic diet and maintain good control of her sugars.  Her previous A1c in October was 7.1%.  Today's recheck shows that she is at 7.3%.  While this is not necessarily excellent control, feel that she is actually doing very well at management.  After discussion of her dietary habits, feel that she is not eating enough or frequently enough throughout the day.  With her nighttime elevations, suspect some of the effect.  Would like her to incorporate a small dinner or nighttime snack that includes carbohydrates as well as proteins to help stabilize her sugars.  Previously attempted to get [employer] covered but it was going to be financially  unfeasible.  A freestyle 3 libre sensor was provided today for a 2-week sample and close monitoring of her sugars while she makes dietary changes.  Sending to her pharmacy to see if insurance coverage is better.  Hyperlipidemia with target LDL less than 100 Currently doing well on pravastatin 20 mg daily.  Taking cod liver oil and following a low-fat heart healthy diet.  Labs up-to-date showing good control of cholesterol with current regimen.  No changes.   Procedures performed this visit: None.  Return in about 6 months (around 06/08/2023) for DM follow up.  __________________________________ Thayer Ohm, DNP, APRN, FNP-BC Primary Care and Sports Medicine [facility] [place]

## 2022-12-07 NOTE — Assessment & Plan Note (Signed)
Pleasant 62 year old female with a history of hypertension currently well-managed with amlodipine 10 mg daily, losartan 50 mg daily, and Toprol-XL 50 mg daily.  Tolerating all medications well without side effects.  Occasionally checks blood pressure at home.  Following a low-sodium diet.  Staying physically active and walking 7 days a week.  Denies concerning symptoms.  On exam, no peripheral edema, HRR, S1/S2 normal.  Lungs CTA, respirations even and unlabored.  Blood pressure is mildly elevated on arrival at 137/77 with recheck of 130/80.  Since this is at goal, no change in medications today.

## 2022-12-07 NOTE — Assessment & Plan Note (Signed)
Currently doing well on pravastatin 20 mg daily.  Taking cod liver oil and following a low-fat heart healthy diet.  Labs up-to-date showing good control of cholesterol with current regimen.  No changes.

## 2022-12-07 NOTE — Assessment & Plan Note (Signed)
Long history of management of type 1 diabetes.  She is currently on Janumet 50-1000 mg twice daily with meals.  Using Toujeo 51 units nightly.  Also using Humalog for carb counting and sliding scale.  Monitoring her sugars at least 3 times daily.  At night, reports that when she has an elevated reading, she will take a small amount of Humalog.  There are times she wakes up in the early morning hours and checks her sugar and has noted that at times it goes up to the 300s.  Has been working very hard over the last 6 months to follow a diabetic diet and maintain good control of her sugars.  Her previous A1c in October was 7.1%.  Today's recheck shows that she is at 7.3%.  While this is not necessarily excellent control, feel that she is actually doing very well at management.  After discussion of her dietary habits, feel that she is not eating enough or frequently enough throughout the day.  With her nighttime elevations, suspect some of the effect.  Would like her to incorporate a small dinner or nighttime snack that includes carbohydrates as well as proteins to help stabilize her sugars.  Previously attempted to get [employer] covered but it was going to be financially unfeasible.  A freestyle 3 libre sensor was provided today for a 2-week sample and close monitoring of her sugars while she makes dietary changes.  Sending to her pharmacy to see if insurance coverage is better.

## 2022-12-08 LAB — CBC WITH DIFFERENTIAL/PLATELET
Absolute Monocytes: 520 cells/uL (ref 200–950)
Basophils Absolute: 48 cells/uL (ref 0–200)
Basophils Relative: 0.6 %
Eosinophils Absolute: 520 cells/uL — ABNORMAL HIGH (ref 15–500)
Eosinophils Relative: 6.5 %
HCT: 42.8 % (ref 35.0–45.0)
Hemoglobin: 13.8 g/dL (ref 11.7–15.5)
Lymphs Abs: 2088 cells/uL (ref 850–3900)
MCH: 26.9 pg — ABNORMAL LOW (ref 27.0–33.0)
MCHC: 32.2 g/dL (ref 32.0–36.0)
MCV: 83.4 fL (ref 80.0–100.0)
MPV: 10 fL (ref 7.5–12.5)
Monocytes Relative: 6.5 %
Neutro Abs: 4824 cells/uL (ref 1500–7800)
Neutrophils Relative %: 60.3 %
Platelets: 362 10*3/uL (ref 140–400)
RBC: 5.13 10*6/uL — ABNORMAL HIGH (ref 3.80–5.10)
RDW: 13.8 % (ref 11.0–15.0)
Total Lymphocyte: 26.1 %
WBC: 8 10*3/uL (ref 3.8–10.8)

## 2022-12-08 LAB — COMPLETE METABOLIC PANEL WITH GFR
AG Ratio: 1.9 (calc) (ref 1.0–2.5)
ALT: 20 U/L (ref 6–29)
AST: 18 U/L (ref 10–35)
Albumin: 4.6 g/dL (ref 3.6–5.1)
Alkaline phosphatase (APISO): 56 U/L (ref 37–153)
BUN: 17 mg/dL (ref 7–25)
CO2: 29 mmol/L (ref 20–32)
Calcium: 10.3 mg/dL (ref 8.6–10.4)
Chloride: 102 mmol/L (ref 98–110)
Creat: 0.75 mg/dL (ref 0.50–1.05)
Globulin: 2.4 g/dL (calc) (ref 1.9–3.7)
Glucose, Bld: 100 mg/dL — ABNORMAL HIGH (ref 65–99)
Potassium: 4.9 mmol/L (ref 3.5–5.3)
Sodium: 142 mmol/L (ref 135–146)
Total Bilirubin: 0.3 mg/dL (ref 0.2–1.2)
Total Protein: 7 g/dL (ref 6.1–8.1)
eGFR: 91 mL/min/{1.73_m2} (ref >=60)

## 2022-12-08 LAB — LIPID PANEL
Cholesterol: 145 mg/dL (ref <200)
HDL: 51 mg/dL (ref >=50)
LDL Cholesterol (Calc): 69 mg/dL (calc)
Non-HDL Cholesterol (Calc): 94 mg/dL (calc) (ref <130)
Total CHOL/HDL Ratio: 2.8 (calc) (ref <5.0)
Triglycerides: 174 mg/dL — ABNORMAL HIGH (ref <150)

## 2022-12-14 ENCOUNTER — Other Ambulatory Visit: Payer: Self-pay | Admitting: Medical-Surgical

## 2022-12-28 ENCOUNTER — Other Ambulatory Visit: Payer: Self-pay | Admitting: Medical-Surgical

## 2022-12-31 ENCOUNTER — Other Ambulatory Visit: Payer: Self-pay | Admitting: Medical-Surgical

## 2022-12-31 DIAGNOSIS — E119 Type 2 diabetes mellitus without complications: Secondary | ICD-10-CM

## 2023-01-02 ENCOUNTER — Other Ambulatory Visit: Payer: Self-pay | Admitting: Medical-Surgical

## 2023-01-14 ENCOUNTER — Other Ambulatory Visit: Payer: Self-pay | Admitting: Medical-Surgical

## 2023-02-06 ENCOUNTER — Other Ambulatory Visit: Payer: Self-pay | Admitting: Medical-Surgical

## 2023-02-27 ENCOUNTER — Encounter: Payer: Self-pay | Admitting: Medical-Surgical

## 2023-02-27 MED ORDER — ONDANSETRON 4 MG PO TBDP: ORAL_TABLET | ORAL | 1 refills | Status: DC

## 2023-03-06 ENCOUNTER — Other Ambulatory Visit: Payer: Self-pay | Admitting: Medical-Surgical

## 2023-03-11 NOTE — Addendum Note (Signed)
Addended by: Chalmers Cater on: 03/11/2023 02:47 PM   Modules accepted: Orders

## 2023-03-12 MED ORDER — ONDANSETRON 4 MG PO TBDP: ORAL_TABLET | ORAL | 3 refills | Status: DC

## 2023-03-12 NOTE — Addendum Note (Signed)
Addended by: Chalmers Cater on: 03/12/2023 01:23 PM   Modules accepted: Orders

## 2023-04-03 ENCOUNTER — Encounter: Payer: Self-pay | Admitting: Medical-Surgical

## 2023-04-16 ENCOUNTER — Telehealth: Payer: BC Managed Care – PPO | Admitting: Family Medicine

## 2023-04-16 DIAGNOSIS — B9689 Other specified bacterial agents as the cause of diseases classified elsewhere: Secondary | ICD-10-CM | POA: Diagnosis not present

## 2023-04-16 DIAGNOSIS — J019 Acute sinusitis, unspecified: Secondary | ICD-10-CM

## 2023-04-16 MED ORDER — AMOXICILLIN-POT CLAVULANATE 875-125 MG PO TABS: 1.0000 | ORAL_TABLET | Freq: Two times a day (BID) | ORAL | 0 refills | Status: AC

## 2023-04-16 NOTE — Progress Notes (Signed)

## 2023-04-17 ENCOUNTER — Other Ambulatory Visit: Payer: Self-pay | Admitting: Medical-Surgical

## 2023-04-17 DIAGNOSIS — Z1231 Encounter for screening mammogram for malignant neoplasm of breast: Secondary | ICD-10-CM

## 2023-04-29 ENCOUNTER — Encounter: Payer: Self-pay | Admitting: Medical-Surgical

## 2023-04-30 DIAGNOSIS — H04123 Dry eye syndrome of bilateral lacrimal glands: Secondary | ICD-10-CM | POA: Diagnosis not present

## 2023-04-30 DIAGNOSIS — E109 Type 1 diabetes mellitus without complications: Secondary | ICD-10-CM | POA: Diagnosis not present

## 2023-04-30 DIAGNOSIS — H25013 Cortical age-related cataract, bilateral: Secondary | ICD-10-CM | POA: Diagnosis not present

## 2023-04-30 DIAGNOSIS — H43393 Other vitreous opacities, bilateral: Secondary | ICD-10-CM | POA: Diagnosis not present

## 2023-06-04 ENCOUNTER — Other Ambulatory Visit: Payer: Self-pay | Admitting: Medical-Surgical

## 2023-06-06 ENCOUNTER — Ambulatory Visit: Payer: BC Managed Care – PPO

## 2023-06-06 DIAGNOSIS — Z1231 Encounter for screening mammogram for malignant neoplasm of breast: Secondary | ICD-10-CM

## 2023-06-06 NOTE — Telephone Encounter (Signed)
Upcoming appointment 06/10/2023

## 2023-06-10 ENCOUNTER — Encounter: Payer: Self-pay | Admitting: Medical-Surgical

## 2023-06-10 ENCOUNTER — Other Ambulatory Visit: Payer: Self-pay | Admitting: Medical-Surgical

## 2023-06-10 ENCOUNTER — Ambulatory Visit (INDEPENDENT_AMBULATORY_CARE_PROVIDER_SITE_OTHER): Payer: BC Managed Care – PPO | Admitting: Medical-Surgical

## 2023-06-10 VITALS — BP 126/74 | HR 81 | Resp 20 | Ht 59.0 in | Wt 168.0 lb

## 2023-06-10 DIAGNOSIS — E785 Hyperlipidemia, unspecified: Secondary | ICD-10-CM

## 2023-06-10 DIAGNOSIS — E1065 Type 1 diabetes mellitus with hyperglycemia: Secondary | ICD-10-CM

## 2023-06-10 DIAGNOSIS — I10 Essential (primary) hypertension: Secondary | ICD-10-CM | POA: Diagnosis not present

## 2023-06-10 DIAGNOSIS — Z1211 Encounter for screening for malignant neoplasm of colon: Secondary | ICD-10-CM

## 2023-06-10 DIAGNOSIS — M79662 Pain in left lower leg: Secondary | ICD-10-CM

## 2023-06-10 DIAGNOSIS — R928 Other abnormal and inconclusive findings on diagnostic imaging of breast: Secondary | ICD-10-CM

## 2023-06-10 LAB — POCT UA - MICROALBUMIN
Albumin/Creatinine Ratio, Urine, POC: <30
Creatinine, POC: 50 mg/dL
Microalbumin Ur, POC: 10 mg/L

## 2023-06-10 LAB — POCT GLYCOSYLATED HEMOGLOBIN (HGB A1C)
HbA1c, POC (controlled diabetic range): 7 % (ref 0.0–7.0)
Hemoglobin A1C: 7 % — AB (ref 4.0–5.6)

## 2023-06-10 NOTE — Progress Notes (Signed)
        Established patient visit  History, exam, impression, and plan:  1. Primary hypertension Kathryn Rogers 62 year old female presenting today with a history of hypertension.  She is currently treated with amlodipine 10 mg daily, losartan 50 mg daily, and Toprol-XL 50 mg daily.  Tolerating all medications well without side effects.  Not regularly checking blood pressure at home although she does have a cuff available.  Following a low-sodium diet.  Exercising every day with walking at least 10,000 steps.  Denies any concerning symptoms today.  Checking labs as below.  Blood pressure elevated on arrival, recheck much better at 126/74. Continue current medications at current doses.  - CMP14+EGFR - Lipid panel  2. Type 1 diabetes mellitus with hyperglycemia (HCC) History of type 2 diabetes since 2010.  She is currently using Toujeo 54 units daily.  Also on Humalog fast acting for carb counting/sliding scale with meals.  Taking Janumet twice daily with meals.  Checking sugars regularly and notes 1 hypoglycemic episode recently with a sugar of 42.  She experienced significant nausea with this but was able to get her sugar back up.  POCT A1c 7.0% today.  Microalbumin normal.  Foot exam completed with no concerns.  Checking renal function.  Continue Janumet, Toujeo, and as needed Humalog as prescribed.  Continue monitoring sugars.  Encouraged use of continuous glucose monitor that she has at home. - POCT HgB A1C - POCT UA - Microalbumin - HM Diabetes Foot Exam - CMP14+EGFR  3. Hyperlipidemia LDL goal <70 History of hyperlipidemia currently taking pravastatin 20 mg daily.  Checking lipids and liver function today.  Continue pravastatin. - CMP14+EGFR - Lipid panel  4. Colon cancer screening Referring to GI for further evaluation and colonoscopy completion. - Ambulatory referral to Gastroenterology  5. Pain of left calf Abrupt onset of left calf pain after having her COVID vaccination.  She denies  any redness or swelling however the posterior mid calf up to the back of the knee is very tender to touch.  No excessive warmth or edema on evaluation.  No redness noted.  She does have varicose veins and is wearing compression socks.  Since she is recently vaccinated and the abrupt onset, plan for ultrasound to rule out DVT.   Procedures performed this visit: None.  Return in about 6 months (around 12/09/2023) for DM/HTN/HLD follow up.  __________________________________ Thayer Ohm, DNP, APRN, FNP-BC Primary Care and Sports Medicine [facility] [place]

## 2023-06-11 ENCOUNTER — Ambulatory Visit (INDEPENDENT_AMBULATORY_CARE_PROVIDER_SITE_OTHER): Payer: BC Managed Care – PPO

## 2023-06-11 DIAGNOSIS — M79662 Pain in left lower leg: Secondary | ICD-10-CM

## 2023-06-11 LAB — CMP14+EGFR
ALT: 17 [IU]/L (ref 0–32)
AST: 16 [IU]/L (ref 0–40)
Albumin: 4.5 g/dL (ref 3.9–4.9)
Alkaline Phosphatase: 62 [IU]/L (ref 44–121)
BUN/Creatinine Ratio: 15 (ref 12–28)
BUN: 11 mg/dL (ref 8–27)
Bilirubin Total: 0.2 mg/dL (ref 0.0–1.2)
CO2: 23 mmol/L (ref 20–29)
Calcium: 9.7 mg/dL (ref 8.7–10.3)
Chloride: 104 mmol/L (ref 96–106)
Creatinine, Ser: 0.72 mg/dL (ref 0.57–1.00)
Globulin, Total: 2.4 g/dL (ref 1.5–4.5)
Glucose: 72 mg/dL (ref 70–99)
Potassium: 4.4 mmol/L (ref 3.5–5.2)
Sodium: 141 mmol/L (ref 134–144)
Total Protein: 6.9 g/dL (ref 6.0–8.5)
eGFR: 95 mL/min/{1.73_m2} (ref >59)

## 2023-06-11 LAB — LIPID PANEL
Chol/HDL Ratio: 2.8 ratio (ref 0.0–4.4)
Cholesterol, Total: 119 mg/dL (ref 100–199)
HDL: 43 mg/dL (ref >39)
LDL Chol Calc (NIH): 56 mg/dL (ref 0–99)
Triglycerides: 110 mg/dL (ref 0–149)
VLDL Cholesterol Cal: 20 mg/dL (ref 5–40)

## 2023-06-19 ENCOUNTER — Encounter: Payer: Self-pay | Admitting: Medical-Surgical

## 2023-06-19 MED ORDER — ONDANSETRON 4 MG PO TBDP: ORAL_TABLET | ORAL | 3 refills | Status: DC

## 2023-06-20 ENCOUNTER — Ambulatory Visit
Admission: RE | Admit: 2023-06-20 | Discharge: 2023-06-20 | Disposition: A | Discharge status: Home / Self Care | Payer: BC Managed Care – PPO | Source: Ambulatory Visit | Attending: Medical-Surgical | Admitting: Medical-Surgical

## 2023-06-20 DIAGNOSIS — R928 Other abnormal and inconclusive findings on diagnostic imaging of breast: Secondary | ICD-10-CM

## 2023-07-11 DIAGNOSIS — R634 Abnormal weight loss: Secondary | ICD-10-CM | POA: Diagnosis not present

## 2023-07-11 DIAGNOSIS — D500 Iron deficiency anemia secondary to blood loss (chronic): Secondary | ICD-10-CM | POA: Diagnosis not present

## 2023-07-12 DIAGNOSIS — K259 Gastric ulcer, unspecified as acute or chronic, without hemorrhage or perforation: Secondary | ICD-10-CM | POA: Diagnosis not present

## 2023-07-12 DIAGNOSIS — K269 Duodenal ulcer, unspecified as acute or chronic, without hemorrhage or perforation: Secondary | ICD-10-CM | POA: Diagnosis not present

## 2023-07-12 DIAGNOSIS — K2950 Unspecified chronic gastritis without bleeding: Secondary | ICD-10-CM | POA: Diagnosis not present

## 2023-07-12 DIAGNOSIS — K6389 Other specified diseases of intestine: Secondary | ICD-10-CM | POA: Diagnosis not present

## 2023-07-12 DIAGNOSIS — D509 Iron deficiency anemia, unspecified: Secondary | ICD-10-CM | POA: Diagnosis not present

## 2023-07-12 DIAGNOSIS — K3189 Other diseases of stomach and duodenum: Secondary | ICD-10-CM | POA: Diagnosis not present

## 2023-07-15 ENCOUNTER — Other Ambulatory Visit: Payer: Self-pay | Admitting: Medical-Surgical

## 2023-07-16 DIAGNOSIS — R110 Nausea: Secondary | ICD-10-CM | POA: Diagnosis not present

## 2023-07-16 DIAGNOSIS — R634 Abnormal weight loss: Secondary | ICD-10-CM | POA: Diagnosis not present

## 2023-07-16 DIAGNOSIS — D509 Iron deficiency anemia, unspecified: Secondary | ICD-10-CM | POA: Diagnosis not present

## 2023-09-02 ENCOUNTER — Other Ambulatory Visit: Payer: Self-pay | Admitting: Medical-Surgical

## 2023-09-19 ENCOUNTER — Other Ambulatory Visit: Payer: Self-pay | Admitting: Medical-Surgical

## 2023-10-10 ENCOUNTER — Other Ambulatory Visit: Payer: Self-pay | Admitting: Medical-Surgical

## 2023-10-10 DIAGNOSIS — J984 Other disorders of lung: Secondary | ICD-10-CM

## 2023-10-21 ENCOUNTER — Other Ambulatory Visit: Payer: Self-pay | Admitting: Medical-Surgical

## 2023-12-02 ENCOUNTER — Other Ambulatory Visit: Payer: Self-pay | Admitting: Medical-Surgical

## 2023-12-09 ENCOUNTER — Encounter: Payer: Self-pay | Admitting: Medical-Surgical

## 2023-12-09 ENCOUNTER — Ambulatory Visit: Payer: BC Managed Care – PPO | Admitting: Medical-Surgical

## 2023-12-09 ENCOUNTER — Other Ambulatory Visit: Payer: Self-pay | Admitting: Medical-Surgical

## 2023-12-09 VITALS — BP 126/74 | HR 72 | Resp 20 | Ht 59.0 in | Wt 168.0 lb

## 2023-12-09 DIAGNOSIS — I10 Essential (primary) hypertension: Secondary | ICD-10-CM | POA: Diagnosis not present

## 2023-12-09 DIAGNOSIS — K51919 Ulcerative colitis, unspecified with unspecified complications: Secondary | ICD-10-CM

## 2023-12-09 DIAGNOSIS — E1065 Type 1 diabetes mellitus with hyperglycemia: Secondary | ICD-10-CM

## 2023-12-09 DIAGNOSIS — E785 Hyperlipidemia, unspecified: Secondary | ICD-10-CM | POA: Diagnosis not present

## 2023-12-09 DIAGNOSIS — J984 Other disorders of lung: Secondary | ICD-10-CM

## 2023-12-09 DIAGNOSIS — E119 Type 2 diabetes mellitus without complications: Secondary | ICD-10-CM

## 2023-12-09 LAB — POCT GLYCOSYLATED HEMOGLOBIN (HGB A1C)
HbA1c, POC (controlled diabetic range): 6.6 % (ref 0.0–7.0)
Hemoglobin A1C: 6.6 % — AB (ref 4.0–5.6)

## 2023-12-09 MED ORDER — ONDANSETRON 4 MG PO TBDP: ORAL_TABLET | ORAL | 3 refills | Status: DC

## 2023-12-09 MED ORDER — TOUJEO SOLOSTAR 300 UNIT/ML ~~LOC~~ SOPN: 54.0000 [IU] | PEN_INJECTOR | Freq: Every day | SUBCUTANEOUS | 5 refills | Status: DC

## 2023-12-09 MED ORDER — OMEPRAZOLE 40 MG PO CPDR: 40.0000 mg | DELAYED_RELEASE_CAPSULE | Freq: Every day | ORAL | 3 refills | Status: DC

## 2023-12-09 MED ORDER — INSULIN GLARGINE-YFGN 100 UNIT/ML ~~LOC~~ SOPN: 54.0000 [IU] | PEN_INJECTOR | Freq: Every day | SUBCUTANEOUS | 3 refills | Status: DC

## 2023-12-09 NOTE — Progress Notes (Signed)
        Established patient visit  History, exam, impression, and plan:  1. Type 1 diabetes mellitus with hyperglycemia (HCC) (Primary) Very pleasant 63 year old female presenting today with a history of type 1 diabetes with her last hemoglobin A1c at 7.0%.  She is taking Janumet  1 tablet twice daily, Toujeo  54 units daily, and Humalog  on a sliding scale for carb counting.  Checking sugar regularly.  Started having hypoglycemic events with the lowest recorded at 36.  She was symptomatic during this time.  Is concerned because she gets very nauseated when her sugar gets this low and is very hard to eat/drink to get her sugar up when she feels this way.  Discussed recommendation for eating a bedtime snack that has a protein base with a little bit of carbohydrates with it.  Reviewed dietary recommendations and would like her to try increasing her daily protein.  Insurance is now covering Toujeo  but at a much higher cost.  She is interested in lower cost options to switching to Semglee  54 units daily.  Continue Janumet  and mealtime insulin  as prescribed. - POCT HgB A1C  2. Primary hypertension History of hypertension currently managed with Toprol -XL 50 mg daily.  Tolerating well without side effects.  Blood pressure checks only on occasion at home and usually when she is feeling bad.  Denies concerning symptoms.  Cardiopulmonary exam is normal.  Checking labs as below.  Blood pressure at goal today.  Continue Toprol  XL as prescribed. - CBC - CMP14+EGFR  3. Hyperlipidemia LDL goal <70 Currently taking pravastatin  20 mg daily, tolerating well without side effects.  Denies myalgias, unusual fatigue, or urinary changes.  Last lipid check at goal.  Checking CMP today.  Continue pravastatin  as prescribed. - CMP14+EGFR  4. Ulcerative colitis with complication, unspecified location [facility]) As seen by GI and underwent a colonoscopy where they found she has significant ulcerative colitis.  Has been prescribed  omeprazole  40 mg daily but is close to being out and her refill requests have not been responded to.  Suspect these request have been going to the GI provider rather than our office.  She does have significant nausea so refilling Zofran  today.  Refilling omeprazole . - omeprazole  (PRILOSEC) 40 MG capsule; Take 1 capsule (40 mg total) by mouth daily.  Dispense: 90 capsule; Refill: 3  Procedures performed this visit: None.  Return in about 6 months (around 06/09/2024) for DM/HTN/HLD follow up.  __________________________________ Maryl Snook, DNP, APRN, FNP-BC Primary Care and Sports Medicine [facility] [place]

## 2023-12-10 ENCOUNTER — Other Ambulatory Visit: Payer: Self-pay | Admitting: Medical-Surgical

## 2023-12-10 ENCOUNTER — Encounter: Payer: Self-pay | Admitting: Medical-Surgical

## 2023-12-10 LAB — CBC
Hematocrit: 39.7 % (ref 34.0–46.6)
Hemoglobin: 12.7 g/dL (ref 11.1–15.9)
MCH: 26.4 pg — ABNORMAL LOW (ref 26.6–33.0)
MCHC: 32 g/dL (ref 31.5–35.7)
MCV: 83 fL (ref 79–97)
Platelets: 368 10*3/uL (ref 150–450)
RBC: 4.81 x10E6/uL (ref 3.77–5.28)
RDW: 13 % (ref 11.7–15.4)
WBC: 7.7 10*3/uL (ref 3.4–10.8)

## 2023-12-10 LAB — CMP14+EGFR
ALT: 32 IU/L (ref 0–32)
AST: 45 IU/L — ABNORMAL HIGH (ref 0–40)
Albumin: 4.3 g/dL (ref 3.9–4.9)
Alkaline Phosphatase: 64 IU/L (ref 44–121)
BUN/Creatinine Ratio: 20 (ref 12–28)
BUN: 16 mg/dL (ref 8–27)
Bilirubin Total: 0.2 mg/dL (ref 0.0–1.2)
CO2: 22 mmol/L (ref 20–29)
Calcium: 9.7 mg/dL (ref 8.7–10.3)
Chloride: 105 mmol/L (ref 96–106)
Creatinine, Ser: 0.82 mg/dL (ref 0.57–1.00)
Globulin, Total: 2.2 g/dL (ref 1.5–4.5)
Glucose: 66 mg/dL — ABNORMAL LOW (ref 70–99)
Potassium: 4.6 mmol/L (ref 3.5–5.2)
Sodium: 144 mmol/L (ref 134–144)
Total Protein: 6.5 g/dL (ref 6.0–8.5)
eGFR: 81 mL/min/{1.73_m2} (ref >59)

## 2023-12-12 ENCOUNTER — Other Ambulatory Visit: Payer: Self-pay | Admitting: Medical-Surgical

## 2023-12-12 MED ORDER — METOPROLOL SUCCINATE ER 50 MG PO TB24: 50.0000 mg | ORAL_TABLET | Freq: Every day | ORAL | 3 refills | Status: DC

## 2023-12-12 NOTE — Telephone Encounter (Signed)
 Requesting rx rf of metoprolol  succinate 50mg   Last written 09/02/2023 Note attached to rx rf request  Refused 1 week ago (12/02/2023):  Request already responded to by other means (e.g. phone or fax)  Last OV 12/09/2023 Upcoming appt 06/09/2024

## 2023-12-23 ENCOUNTER — Other Ambulatory Visit: Payer: Self-pay | Admitting: Medical-Surgical

## 2024-01-03 ENCOUNTER — Other Ambulatory Visit: Payer: Self-pay | Admitting: Medical-Surgical

## 2024-01-08 ENCOUNTER — Other Ambulatory Visit: Payer: Self-pay | Admitting: Medical-Surgical

## 2024-01-20 ENCOUNTER — Other Ambulatory Visit: Payer: Self-pay | Admitting: Medical-Surgical

## 2024-01-23 ENCOUNTER — Other Ambulatory Visit: Payer: Self-pay | Admitting: Medical-Surgical

## 2024-01-24 ENCOUNTER — Other Ambulatory Visit: Payer: Self-pay | Admitting: Medical-Surgical

## 2024-01-28 ENCOUNTER — Encounter: Payer: Self-pay | Admitting: Medical-Surgical

## 2024-01-28 NOTE — Telephone Encounter (Signed)
 Patient scheduled tomorrow with Tandy Gaw, PA

## 2024-01-28 NOTE — Telephone Encounter (Signed)
 Yes, please.

## 2024-01-29 ENCOUNTER — Ambulatory Visit: Admitting: Physician Assistant

## 2024-03-26 ENCOUNTER — Other Ambulatory Visit: Payer: Self-pay | Admitting: Medical-Surgical

## 2024-04-13 ENCOUNTER — Other Ambulatory Visit: Payer: Self-pay | Admitting: Medical-Surgical

## 2024-04-20 ENCOUNTER — Other Ambulatory Visit: Payer: Self-pay | Admitting: Medical-Surgical

## 2024-04-21 ENCOUNTER — Encounter: Payer: Self-pay | Admitting: Sports Medicine

## 2024-05-04 ENCOUNTER — Encounter: Payer: Self-pay | Admitting: Medical-Surgical

## 2024-05-04 DIAGNOSIS — E1065 Type 1 diabetes mellitus with hyperglycemia: Secondary | ICD-10-CM

## 2024-05-04 MED ORDER — COVID-19 MRNA VACC (MODERNA) 50 MCG/0.5ML IM SUSY: 0.5000 mL | PREFILLED_SYRINGE | Freq: Once | INTRAMUSCULAR | 0 refills | Status: AC

## 2024-05-27 ENCOUNTER — Other Ambulatory Visit: Payer: Self-pay | Admitting: Medical-Surgical

## 2024-05-28 ENCOUNTER — Other Ambulatory Visit: Payer: Self-pay | Admitting: Medical-Surgical

## 2024-05-30 ENCOUNTER — Encounter: Payer: Self-pay | Admitting: Medical-Surgical

## 2024-06-01 MED ORDER — INSULIN LISPRO (1 UNIT DIAL) 100 UNIT/ML (KWIKPEN): PEN_INJECTOR | SUBCUTANEOUS | 0 refills | Status: AC

## 2024-06-09 ENCOUNTER — Ambulatory Visit: Admitting: Physician Assistant

## 2024-06-09 ENCOUNTER — Ambulatory Visit: Admitting: Medical-Surgical

## 2024-06-09 ENCOUNTER — Encounter: Payer: Self-pay | Admitting: Medical-Surgical

## 2024-06-09 VITALS — BP 131/80 | HR 74 | Resp 20 | Ht 59.0 in | Wt 164.2 lb

## 2024-06-09 DIAGNOSIS — M654 Radial styloid tenosynovitis [de Quervain]: Secondary | ICD-10-CM | POA: Diagnosis not present

## 2024-06-09 DIAGNOSIS — E1065 Type 1 diabetes mellitus with hyperglycemia: Secondary | ICD-10-CM

## 2024-06-09 DIAGNOSIS — K51919 Ulcerative colitis, unspecified with unspecified complications: Secondary | ICD-10-CM

## 2024-06-09 DIAGNOSIS — I10 Essential (primary) hypertension: Secondary | ICD-10-CM | POA: Diagnosis not present

## 2024-06-09 LAB — POCT UA - MICROALBUMIN
Creatinine, POC: 300 mg/dL
Microalbumin Ur, POC: 80 mg/L

## 2024-06-09 LAB — POCT GLYCOSYLATED HEMOGLOBIN (HGB A1C)
HbA1c, POC (controlled diabetic range): 6.8 % (ref 0.0–7.0)
Hemoglobin A1C: 6.8 % — AB (ref 4.0–5.6)

## 2024-06-09 MED ORDER — OMEPRAZOLE 40 MG PO CPDR: 40.0000 mg | DELAYED_RELEASE_CAPSULE | Freq: Every day | ORAL | 3 refills | Status: AC

## 2024-06-09 MED ORDER — INSULIN LISPRO (1 UNIT DIAL) 100 UNIT/ML (KWIKPEN): PEN_INJECTOR | SUBCUTANEOUS | 11 refills | Status: AC

## 2024-06-09 MED ORDER — AMLODIPINE BESYLATE 10 MG PO TABS: 10.0000 mg | ORAL_TABLET | Freq: Every day | ORAL | 3 refills | Status: AC

## 2024-06-09 MED ORDER — LOSARTAN POTASSIUM 50 MG PO TABS: 50.0000 mg | ORAL_TABLET | Freq: Every day | ORAL | 3 refills | Status: AC

## 2024-06-09 MED ORDER — TOUJEO SOLOSTAR 300 UNIT/ML ~~LOC~~ SOPN: 54.0000 [IU] | PEN_INJECTOR | Freq: Every day | SUBCUTANEOUS | 5 refills | Status: AC

## 2024-06-09 MED ORDER — JANUMET 50-1000 MG PO TABS: 1.0000 | ORAL_TABLET | Freq: Two times a day (BID) | ORAL | 3 refills | Status: AC

## 2024-06-09 MED ORDER — METOPROLOL SUCCINATE ER 50 MG PO TB24: 50.0000 mg | ORAL_TABLET | Freq: Every day | ORAL | 3 refills | Status: AC

## 2024-06-09 MED ORDER — FREESTYLE LITE W/DEVICE KIT: 1.0000 | PACK | 0 refills | Status: DC | PRN

## 2024-06-09 MED ORDER — PRAVASTATIN SODIUM 20 MG PO TABS: 20.0000 mg | ORAL_TABLET | Freq: Every day | ORAL | 3 refills | Status: AC

## 2024-06-09 NOTE — Progress Notes (Addendum)
 Established Patient Office Visit  Subjective   Patient ID: Kathryn Rogers, female    DOB: [birth year]  Age: 63 y.o. MRN: [id number]  Chief Complaint  Patient presents with   Diabetes    HPI  47 year olf female presents to the office today for follow up on  Primary Hypertension: -Patient is currently taking amlodipine  10 mg, losartan  50 mg, metoprolol  Succinate 50 mg daily. Patient is doing well with her blood pressure medications. She denies having any side effects to her medications. She does not routinely check her blood pressure at home, but will if she feels like it may be a little high.   Type 1 Diabetes: Patient is taking Toujeo  54 units daily, Humalog  4-8 units as needed, and Janumet  BID. She does take her blood sugars three times per day. Average fasting 118, middle of the day average 118-120, and at nighttime less than 60. Patient states that she does not eat dinner which contributes to her low blood sugars at night. Previous AIC 6.6% in April 2025. AIC today is 6.8%.  Ulcerative Colitis -Patient stable on omeprazole  40 mg daily and zofran  4 mg prn. Denies having any recent flare ups.  Left wrist Pain -patient states that she was moving a armenia cabinet about a month ago. Patient Reports pain with internal and external rotation, and gripping. Pain improved with alternating  ibuprofen 400 mg, tylenol 500 mg when needed, and ice compress. She has not tried using a wrist brace. Symptoms worse at night. Patient rates pain as a 3/10 without aggravation and 10/10 with certain movements. Denies any numbness and tingling.   Review of Systems  Constitutional: Negative.   HENT: Negative.    Eyes: Negative.   Respiratory: Negative.    Cardiovascular: Negative.   Gastrointestinal: Negative.   Genitourinary: Negative.   Musculoskeletal: Negative.   Skin: Negative.   Neurological: Negative.   Endo/Heme/Allergies: Negative.   Psychiatric/Behavioral: Negative.        Objective:      BP (!) 147/84 (BP Location: Left Arm, Cuff Size: Normal)   Pulse 79   Resp 20   Ht 4' 11 (1.499 m)   Wt 74.5 kg   SpO2 97%   BMI 33.16 kg/m  BP Readings from Last 3 Encounters:  06/09/24 (!) 147/84  12/09/23 126/74  06/10/23 126/74   Wt Readings from Last 3 Encounters:  06/09/24 74.5 kg  12/09/23 76.2 kg  06/10/23 76.2 kg      Physical Exam Vitals and nursing note reviewed.  Constitutional:      General: She is not in acute distress.    Appearance: Normal appearance.  Cardiovascular:     Rate and Rhythm: Normal rate and regular rhythm.     Pulses: Normal pulses.     Heart sounds: Normal heart sounds.  Pulmonary:     Effort: Pulmonary effort is normal.     Breath sounds: Normal breath sounds.  Musculoskeletal:     Left wrist: Tenderness and bony tenderness present.     Comments: + Finkelstein test  Feet:     Comments: Passed Diabetic Foot Exam Neurological:     General: No focal deficit present.     Mental Status: She is alert and oriented to person, place, and time.  Psychiatric:        Mood and Affect: Mood normal.        Behavior: Behavior normal.        Thought Content: Thought content normal.  Judgment: Judgment normal.      No results found for any visits on 06/09/24.  Last metabolic panel Lab Results  Component Value Date   GLUCOSE 66 (L) 12/09/2023   NA 144 12/09/2023   K 4.6 12/09/2023   CL 105 12/09/2023   CO2 22 12/09/2023   BUN 16 12/09/2023   CREATININE 0.82 12/09/2023   EGFR 81 12/09/2023   CALCIUM  9.7 12/09/2023   PROT 6.5 12/09/2023   ALBUMIN 4.3 12/09/2023   LABGLOB 2.2 12/09/2023   BILITOT 0.2 12/09/2023   ALKPHOS 64 12/09/2023   AST 45 (H) 12/09/2023   ALT 32 12/09/2023   ANIONGAP 8 03/09/2022   Last hemoglobin A1c Lab Results  Component Value Date   HGBA1C 6.6 (A) 12/09/2023   HGBA1C 6.6 12/09/2023    The ASCVD Risk score (Arnett DK, et al., 2019) failed to calculate for the following reasons:   The valid  total cholesterol range is 130 to 320 mg/dL    Assessment & Plan:  1. Primary hypertension (Primary) -Stable on metoprolol  succinate 50 mg, amlodipine , 10 mg, and losartan  50 mg daily - CMP14+EGFR - Lipid panel  2. Type 1 diabetes mellitus with hyperglycemia (HCC) -Stable on Toujeo  54 units daily, Humalog  4-8 units as needed, and Janumet  50-1000 mg BID - POCT HgB A1C - CMP14+EGFR - POCT UA - Microalbumin - insulin  glargine, 1 Unit Dial , (TOUJEO  SOLOSTAR) 300 UNIT/ML Solostar Pen; Inject 54 Units into the skin daily.  Dispense: 15 mL; Refill: 5  3. Ulcerative colitis with complication, unspecified location (HCC) -Stable on omeprazole  40 mg daily and zofran  4 mg prn - omeprazole  (PRILOSEC) 40 MG capsule; Take 1 capsule (40 mg total) by mouth daily.  Dispense: 90 capsule; Refill: 3  4. De Quervain's tenosynovitis, left -Wrist brace with thumb spica ordered.  -Recommend wearing brace at night -Ice compress  -Recommended using Voltaren gel four times per day   -Ibuprofen 400 mg as needed in moderation due to history of ulcerative colitis -Patient to follow up in about 6 weeks if pain does not improve  Return in about 6 months (around 12/08/2024) for DM/HTN/HLD follow up.    Derrek JINNY Freund, NP Student

## 2024-06-09 NOTE — Progress Notes (Signed)
 Medical screening examination/treatment was performed by qualified clinical staff member and as supervising provider I was immediately available for consultation/collaboration. I have reviewed documentation and agree with assessment and plan.  Thayer Ohm, DNP, APRN, FNP-BC [facility] and Sports Medicine

## 2024-06-10 ENCOUNTER — Ambulatory Visit: Payer: Self-pay | Admitting: Medical-Surgical

## 2024-06-10 LAB — CMP14+EGFR
ALT: 17 IU/L (ref 0–32)
AST: 17 IU/L (ref 0–40)
Albumin: 4.5 g/dL (ref 3.9–4.9)
Alkaline Phosphatase: 59 IU/L (ref 49–135)
BUN/Creatinine Ratio: 16 (ref 12–28)
BUN: 15 mg/dL (ref 8–27)
Bilirubin Total: 0.2 mg/dL (ref 0.0–1.2)
CO2: 22 mmol/L (ref 20–29)
Calcium: 10 mg/dL (ref 8.7–10.3)
Chloride: 102 mmol/L (ref 96–106)
Creatinine, Ser: 0.93 mg/dL (ref 0.57–1.00)
Globulin, Total: 2.6 g/dL (ref 1.5–4.5)
Glucose: 61 mg/dL — ABNORMAL LOW (ref 70–99)
Potassium: 4.6 mmol/L (ref 3.5–5.2)
Sodium: 142 mmol/L (ref 134–144)
Total Protein: 7.1 g/dL (ref 6.0–8.5)
eGFR: 69 mL/min/1.73 (ref >59)

## 2024-06-10 LAB — LIPID PANEL
Chol/HDL Ratio: 2.7 ratio (ref 0.0–4.4)
Cholesterol, Total: 130 mg/dL (ref 100–199)
HDL: 49 mg/dL (ref >39)
LDL Chol Calc (NIH): 58 mg/dL (ref 0–99)
Triglycerides: 128 mg/dL (ref 0–149)
VLDL Cholesterol Cal: 23 mg/dL (ref 5–40)

## 2024-06-12 ENCOUNTER — Telehealth: Payer: Self-pay

## 2024-06-12 ENCOUNTER — Other Ambulatory Visit: Payer: Self-pay

## 2024-06-12 MED ORDER — FREESTYLE LITE TEST VI STRP: ORAL_STRIP | 12 refills | Status: AC

## 2024-06-12 MED ORDER — FREESTYLE LANCETS MISC: 12 refills | Status: AC

## 2024-06-12 MED ORDER — FREESTYLE LITE W/DEVICE KIT: 1.0000 | PACK | 0 refills | Status: AC | PRN

## 2024-06-12 NOTE — Telephone Encounter (Addendum)
 [facility]oke with [facility]  Told her that the patient chart shows that  the  Freestyle lite strips were sent on 06/09/2024  They state that this was cancelled for some reason.   They are needing to have this resent along with Freestyle testing strips and freestyle lancets - with frequency and qty resent to them   How may times per day did you want this patient testing blood sugar ?  Pended prescriptions  Is this correct that patient is changing from freestyle libre sensors to regular glucose checking ?

## 2024-06-12 NOTE — Telephone Encounter (Signed)
 Copied from CRM [id number]. Topic: Clinical - Prescription Issue >> Jun 11, 2024  4:22 PM Dedra B wrote: Reason for CRM: Elijah fro [facility] said pt's plan does not cover OneTouch Ultra test strips. He wants to know if pt uses an insulin  pump or glucose monitor that requires specific test strips. If no, then the pt's plan prefers freestyle lite test strips 50 or 100 count by [employer] or TrueMetrix test strips 100 count by [facility] [employer]. Elija can be reached at [phone]. Invoice number is [id number].

## 2024-06-12 NOTE — Telephone Encounter (Signed)
 Freestyle lite device sent on 06/09/2024  Attempted [facility] but pharmacy not yet open. Will call again at later time.

## 2024-06-12 NOTE — Telephone Encounter (Signed)
 Attempted call to patient. Left a voice mail message requesting a return call.

## 2024-06-15 NOTE — Telephone Encounter (Signed)
 Freestyle lite glucometer and supplies sent to pharmacy - patient informed via Mychart.

## 2024-06-16 ENCOUNTER — Other Ambulatory Visit: Payer: Self-pay | Admitting: Medical-Surgical

## 2024-06-16 MED ORDER — ONDANSETRON 4 MG PO TBDP: ORAL_TABLET | ORAL | 1 refills | Status: DC

## 2024-08-10 ENCOUNTER — Encounter: Payer: Self-pay | Admitting: Medical-Surgical

## 2024-08-10 MED ORDER — ONDANSETRON 4 MG PO TBDP: ORAL_TABLET | ORAL | 1 refills | Status: AC

## 2024-12-08 ENCOUNTER — Ambulatory Visit: Admitting: Medical-Surgical
